# Patient Record
Sex: Female | Born: 1980 | Race: White | Hispanic: Yes | Marital: Single | Smoking: Current some day smoker
Health system: Southern US, Community
[De-identification: ages and names within clinical notes are randomized; demographics above are authoritative.]

## PROBLEM LIST (undated history)

## (undated) HISTORY — PX: THROAT SURGERY: SHX803

## (undated) MED ORDER — FLUOXETINE HCL 10 MG OR CAPS
10.00 mg | ORAL_CAPSULE | Freq: Every day | ORAL | 0 refills | Status: AC
Start: 2020-11-16 — End: ?

---

## 2004-03-19 ENCOUNTER — Emergency Department (HOSPITAL_COMMUNITY): Admission: EM | Admit: 2004-03-19 | Discharge: 2004-03-20 | Payer: Self-pay | Admitting: Emergency Medicine

## 2008-04-06 ENCOUNTER — Encounter: Payer: Self-pay | Admitting: Family Medicine

## 2008-04-06 ENCOUNTER — Ambulatory Visit: Payer: Self-pay | Admitting: Internal Medicine

## 2008-04-06 LAB — CONVERTED CEMR LAB: Chlamydia, DNA Probe: NEGATIVE

## 2008-04-07 ENCOUNTER — Ambulatory Visit: Payer: Self-pay | Admitting: *Deleted

## 2008-04-18 ENCOUNTER — Ambulatory Visit: Payer: Self-pay | Admitting: Internal Medicine

## 2008-05-13 ENCOUNTER — Ambulatory Visit: Payer: Self-pay | Admitting: Family Medicine

## 2008-05-13 ENCOUNTER — Encounter: Payer: Self-pay | Admitting: Family Medicine

## 2008-05-14 ENCOUNTER — Encounter: Payer: Self-pay | Admitting: Family Medicine

## 2008-05-14 LAB — CONVERTED CEMR LAB: Chlamydia, DNA Probe: NEGATIVE

## 2008-06-09 ENCOUNTER — Inpatient Hospital Stay (HOSPITAL_COMMUNITY): Admission: EM | Admit: 2008-06-09 | Discharge: 2008-06-24 | Payer: Self-pay | Admitting: Emergency Medicine

## 2008-06-09 ENCOUNTER — Ambulatory Visit: Payer: Self-pay | Admitting: Internal Medicine

## 2008-06-11 ENCOUNTER — Encounter (INDEPENDENT_AMBULATORY_CARE_PROVIDER_SITE_OTHER): Payer: Self-pay | Admitting: Internal Medicine

## 2008-07-06 ENCOUNTER — Ambulatory Visit: Payer: Self-pay | Admitting: Family Medicine

## 2008-07-08 ENCOUNTER — Ambulatory Visit: Payer: Self-pay | Admitting: Internal Medicine

## 2008-08-08 ENCOUNTER — Ambulatory Visit: Payer: Self-pay | Admitting: Family Medicine

## 2008-08-31 ENCOUNTER — Encounter: Payer: Self-pay | Admitting: Family Medicine

## 2008-08-31 ENCOUNTER — Ambulatory Visit: Payer: Self-pay | Admitting: Internal Medicine

## 2008-08-31 LAB — CONVERTED CEMR LAB
ALT: 21 units/L (ref 0–35)
CO2: 18 meq/L — ABNORMAL LOW (ref 19–32)
Creatinine, Ser: 0.6 mg/dL (ref 0.40–1.20)
Eosinophils Absolute: 0.1 10*3/uL (ref 0.0–0.7)
HCT: 42 % (ref 36.0–46.0)
HDL: 39 mg/dL — ABNORMAL LOW (ref >39)
Hemoglobin: 14 g/dL (ref 12.0–15.0)
LDL Cholesterol: 123 mg/dL — ABNORMAL HIGH (ref 0–99)
Lymphocytes Relative: 37 % (ref 12–46)
Lymphs Abs: 2.5 10*3/uL (ref 0.7–4.0)
MCV: 90.1 fL (ref 78.0–100.0)
Monocytes Absolute: 0.4 10*3/uL (ref 0.1–1.0)
Monocytes Relative: 6 % (ref 3–12)
Neutro Abs: 3.8 10*3/uL (ref 1.7–7.7)
Platelets: 241 10*3/uL (ref 150–400)
RDW: 13.6 % (ref 11.5–15.5)
Total CHOL/HDL Ratio: 5.2
Triglycerides: 207 mg/dL — ABNORMAL HIGH (ref <150)
VLDL: 41 mg/dL — ABNORMAL HIGH (ref 0–40)
WBC: 6.8 10*3/uL (ref 4.0–10.5)

## 2008-09-05 ENCOUNTER — Ambulatory Visit: Payer: Self-pay | Admitting: Internal Medicine

## 2008-09-09 ENCOUNTER — Ambulatory Visit (HOSPITAL_COMMUNITY): Admission: RE | Admit: 2008-09-09 | Discharge: 2008-09-09 | Payer: Self-pay | Admitting: Family Medicine

## 2008-10-03 ENCOUNTER — Ambulatory Visit: Payer: Self-pay | Admitting: Internal Medicine

## 2008-10-19 ENCOUNTER — Ambulatory Visit: Payer: Self-pay | Admitting: Internal Medicine

## 2008-10-19 DIAGNOSIS — R0989 Other specified symptoms and signs involving the circulatory and respiratory systems: Secondary | ICD-10-CM

## 2008-10-19 DIAGNOSIS — R0609 Other forms of dyspnea: Secondary | ICD-10-CM

## 2008-11-02 ENCOUNTER — Ambulatory Visit: Payer: Self-pay | Admitting: Internal Medicine

## 2008-11-28 ENCOUNTER — Encounter: Payer: Self-pay | Admitting: Internal Medicine

## 2008-11-28 ENCOUNTER — Ambulatory Visit: Payer: Self-pay | Admitting: Internal Medicine

## 2008-11-28 DIAGNOSIS — K219 Gastro-esophageal reflux disease without esophagitis: Secondary | ICD-10-CM

## 2008-11-28 DIAGNOSIS — J309 Allergic rhinitis, unspecified: Secondary | ICD-10-CM | POA: Insufficient documentation

## 2008-11-28 DIAGNOSIS — J209 Acute bronchitis, unspecified: Secondary | ICD-10-CM | POA: Insufficient documentation

## 2008-12-19 ENCOUNTER — Ambulatory Visit: Payer: Self-pay | Admitting: Internal Medicine

## 2009-01-20 ENCOUNTER — Ambulatory Visit (HOSPITAL_COMMUNITY): Admission: RE | Admit: 2009-01-20 | Discharge: 2009-01-20 | Payer: Self-pay | Admitting: Otolaryngology

## 2009-02-13 ENCOUNTER — Ambulatory Visit (HOSPITAL_COMMUNITY): Admission: RE | Admit: 2009-02-13 | Discharge: 2009-02-13 | Payer: Self-pay | Admitting: Otolaryngology

## 2009-03-20 ENCOUNTER — Ambulatory Visit: Payer: Self-pay | Admitting: Internal Medicine

## 2009-12-26 ENCOUNTER — Ambulatory Visit: Payer: Self-pay | Admitting: Internal Medicine

## 2010-03-26 IMAGING — CR DG CHEST 2V
2 series · 2 of 2 positions shown · non-contrast
Comparison: None available.

CLINICAL DATA: Fever.

CHEST - 2 VIEW

[w chest pa]
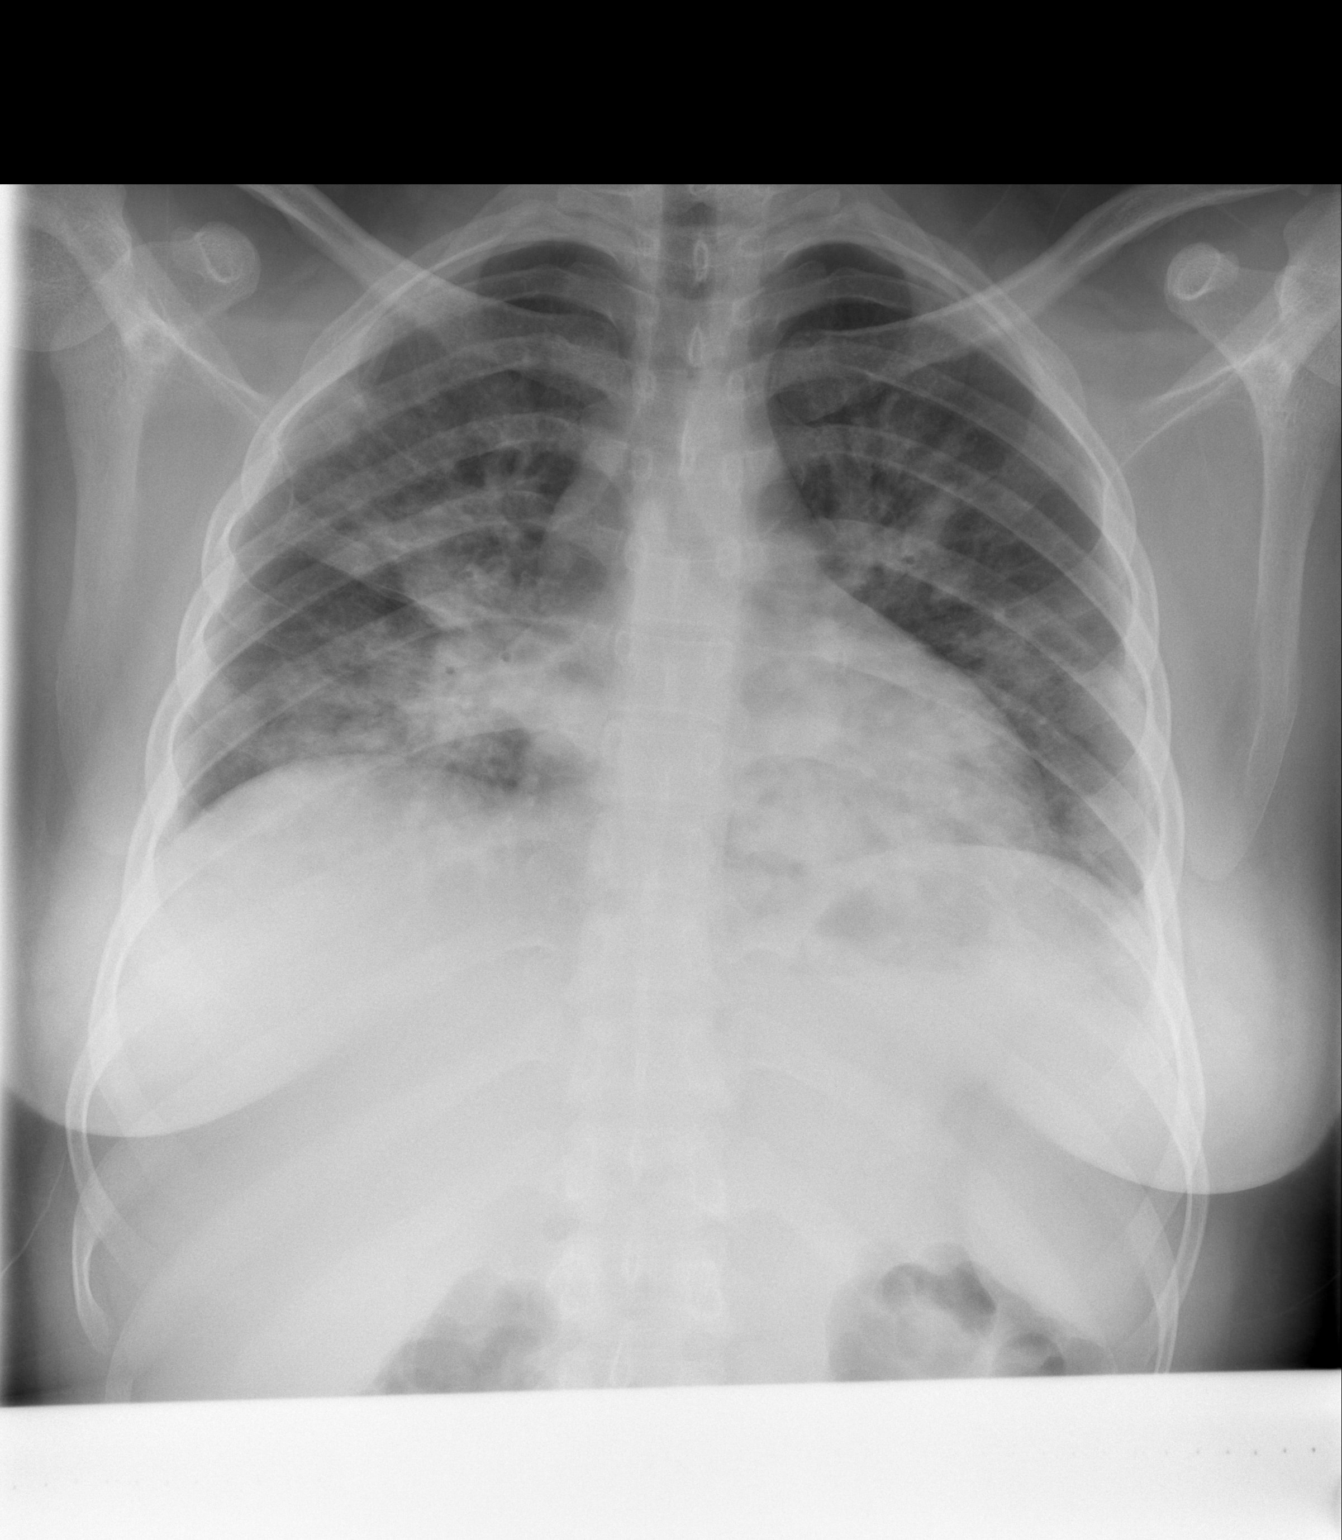

[w chest lat]
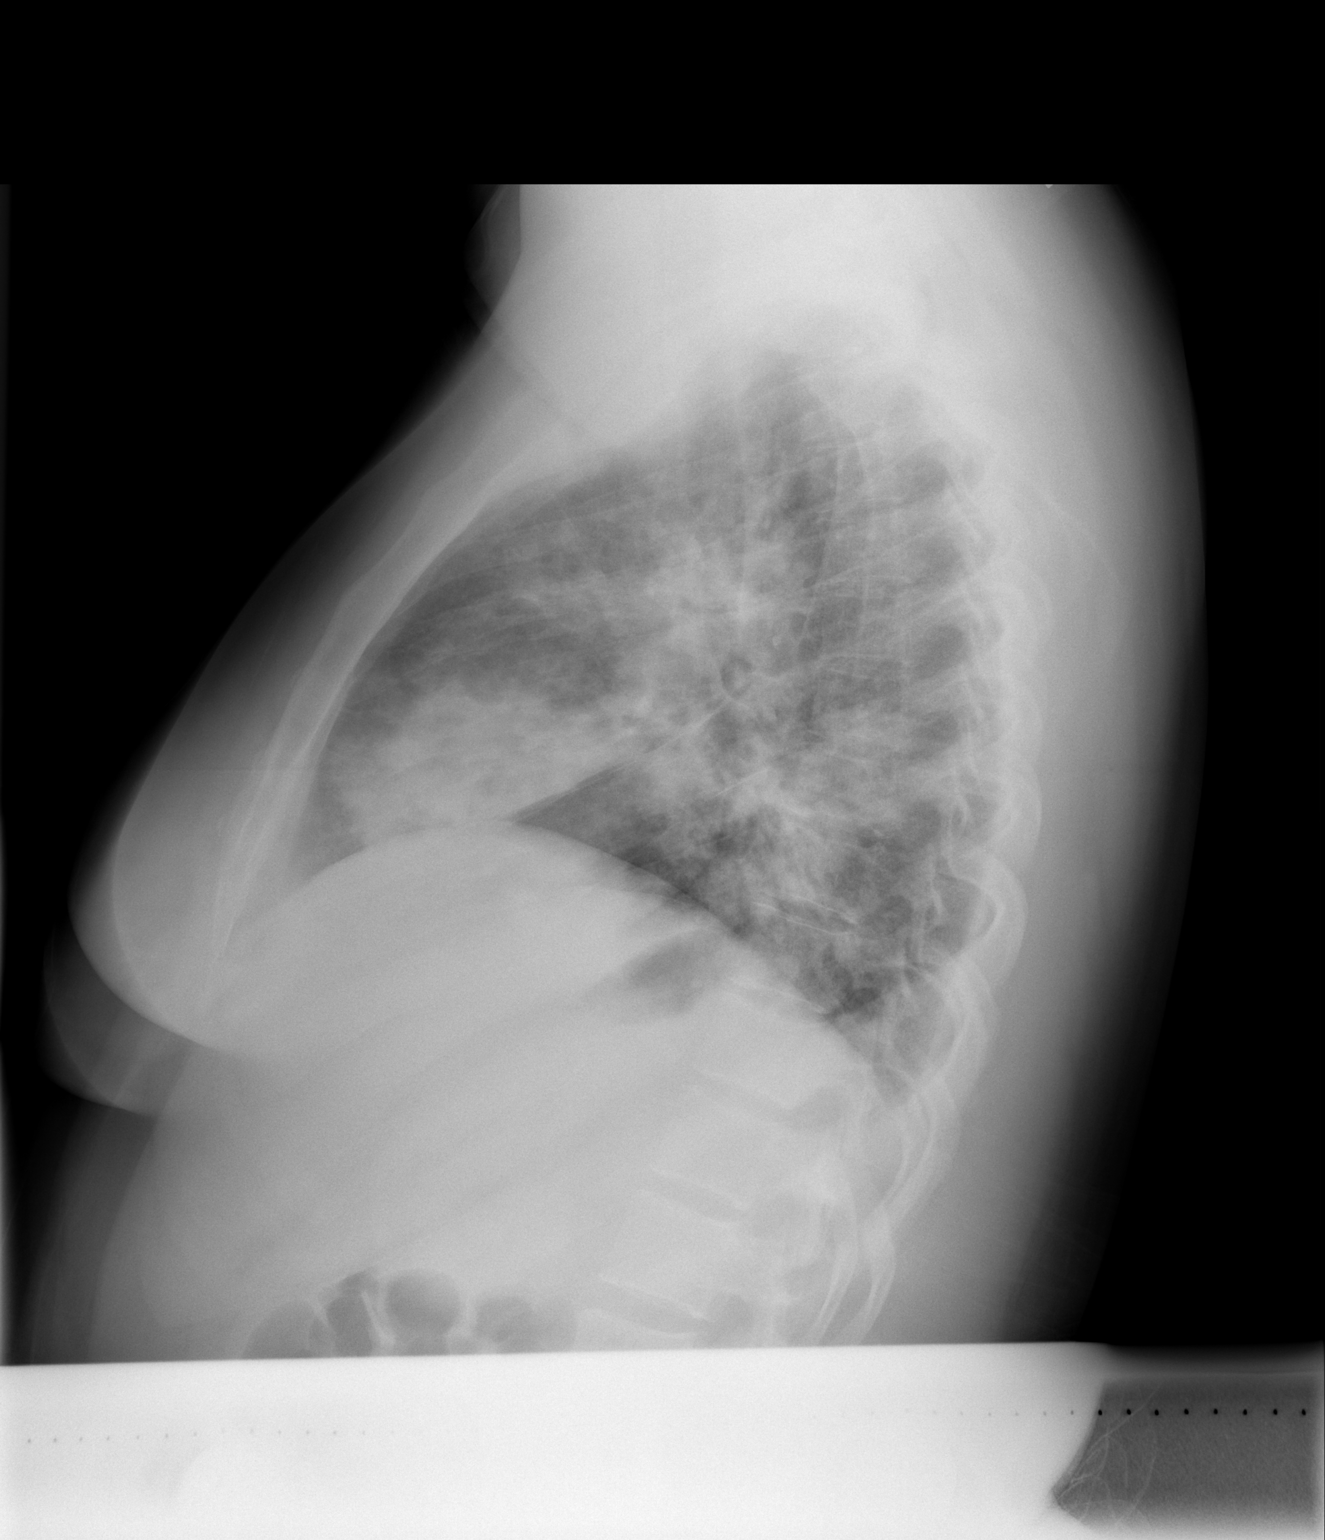

[2 of 2 positions shown; findings below may reference images not displayed]

FINDINGS: Patchy bilateral airspace disease is present.  There is
disease involving the right upper lobe and right middle lobe.  The
left lower lobe airspace disease is also seen.  Mild edema is
present.  Heart size is normal.  Lung volumes are low.
IMPRESSION: 1.  Multilobar pneumonia involving at least the right upper, right
middle, and left lower lobe.
2.  Mild edema.

## 2010-11-06 IMAGING — CT CT NECK W/ CM
3 of 4 series · 16 of 33 positions shown, 19 images · IV contrast (agent unspecified)
Comparison: None

CLINICAL DATA: Recent pneumonia.  Long-term intubation.  Assess for
tracheal or subglottic stenosis.

CT NECK WITH CONTRAST
TECHNIQUE: Multidetector CT imaging of the neck was performed with
intravenous contrast.
Contrast: 100 ml 5mnipaque-TPP

[Series 2: 2cc/(id)/1cc/(id) · axial · 0.43mm/px · z∈[-222,-46]mm · 8 of 59 slices shown, 10 images]
[im 6/59  soft-tissue]
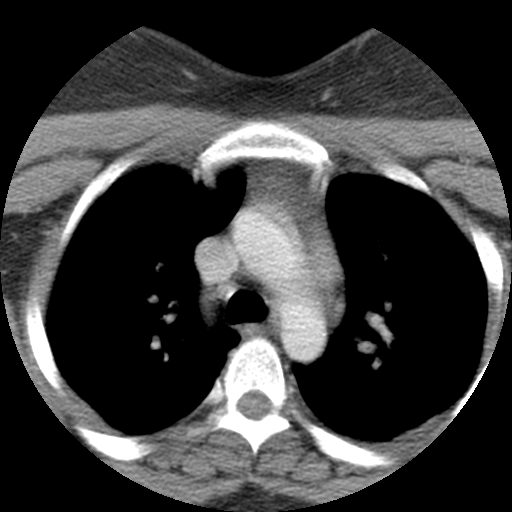
[im 6/59  bone]
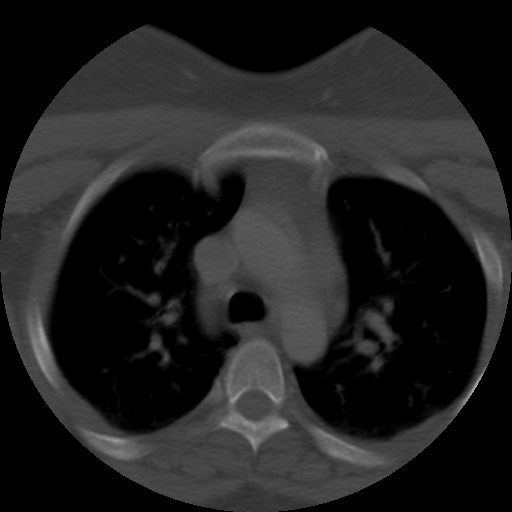
[im 12/59  bone]
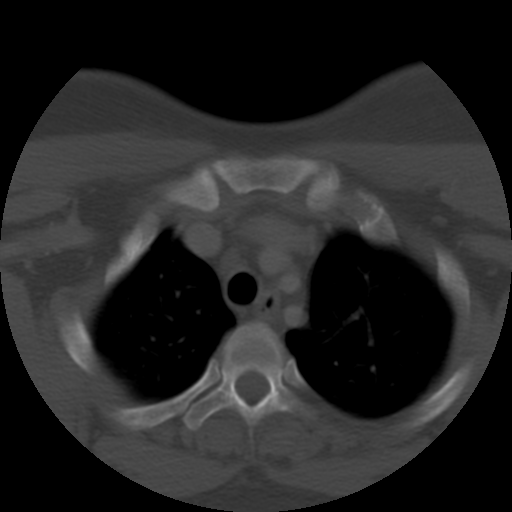
[im 18/59  bone]
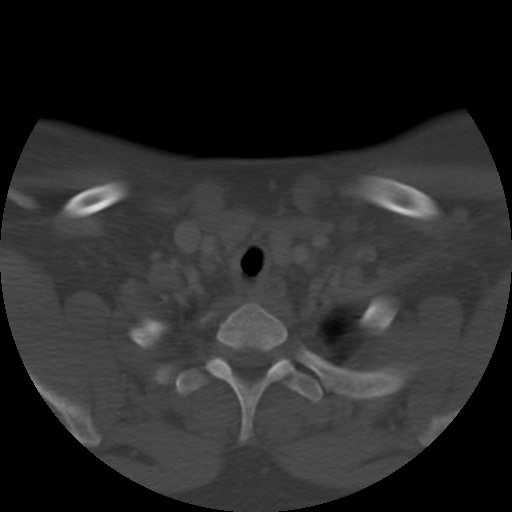
[im 24/59  bone]
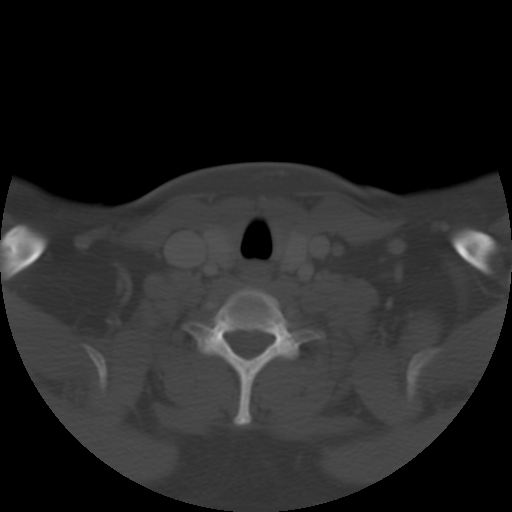
[im 35/59  soft-tissue]
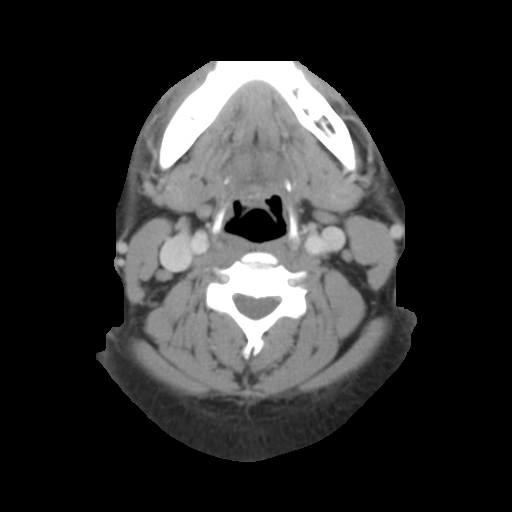
[im 35/59  bone]
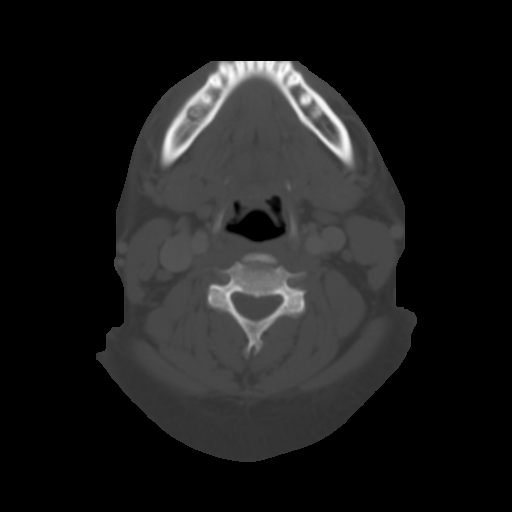
[im 41/59  bone]
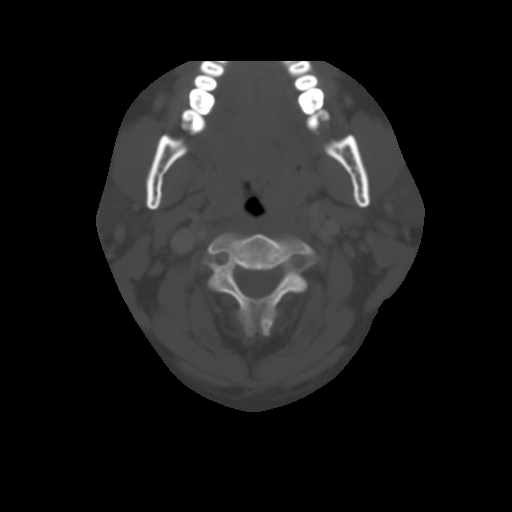
[im 47/59  bone]
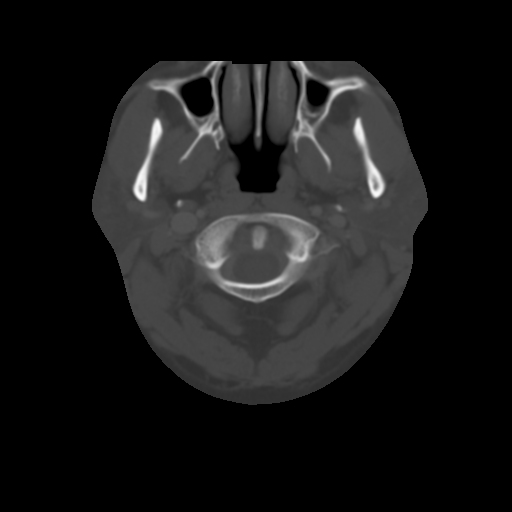
[im 53/59  bone]
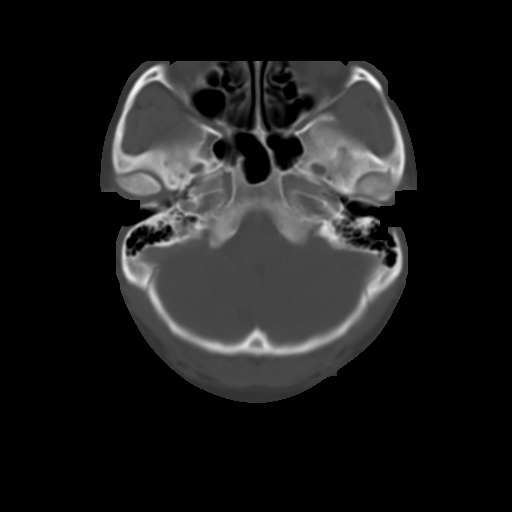

[Series 300: sag neck · sagittal · 0.43mm/px · 5 of 83 slices shown, 6 images]
[im 28/83  bone]
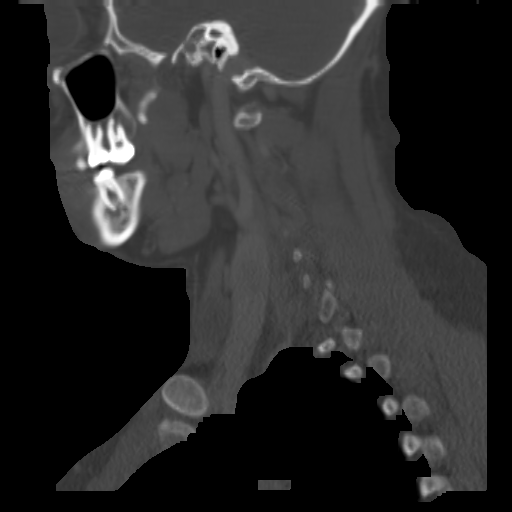
[im 35/83  bone]
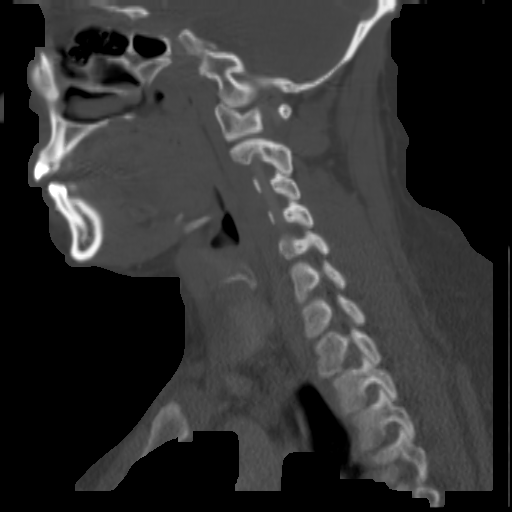
[im 42/83  soft-tissue]
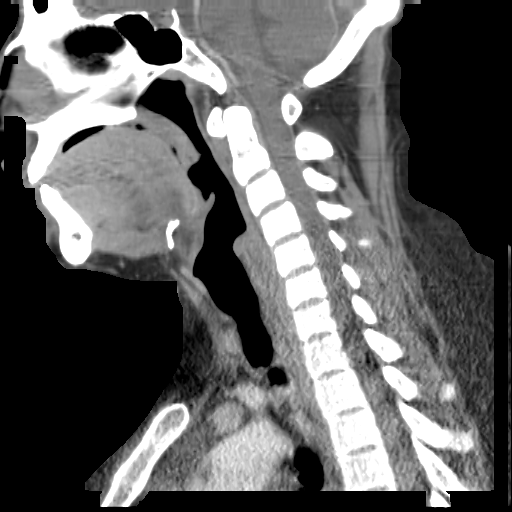
[im 42/83  bone]
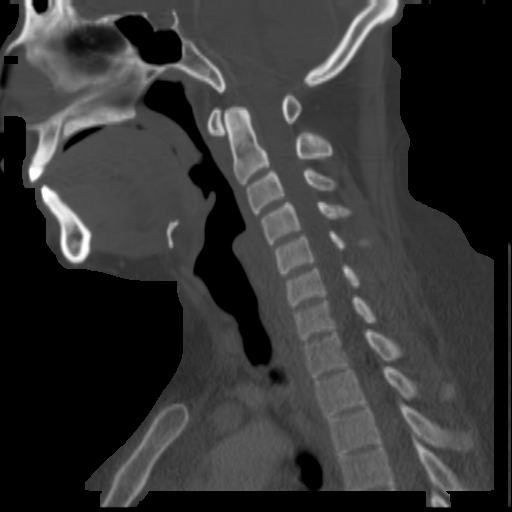
[im 48/83  bone]
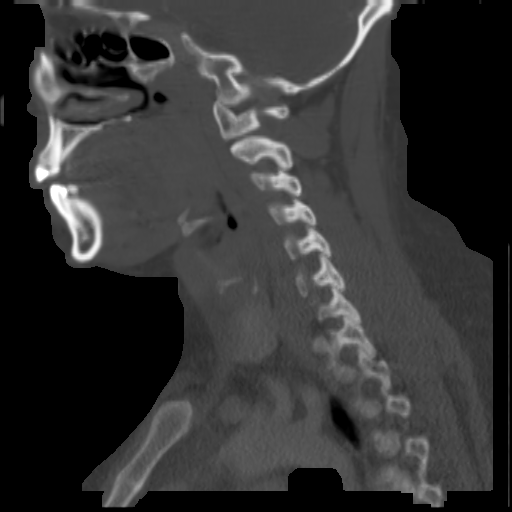
[im 55/83  bone]
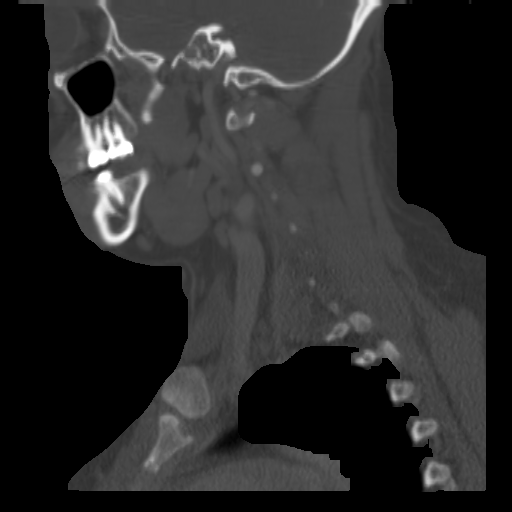

[Series 301: cor neck · coronal · 0.43mm/px · 3 of 84 slices shown]
[im 22/84  bone]
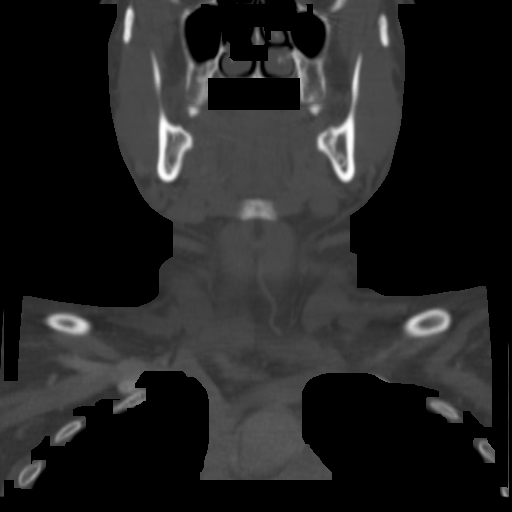
[im 35/84  bone]
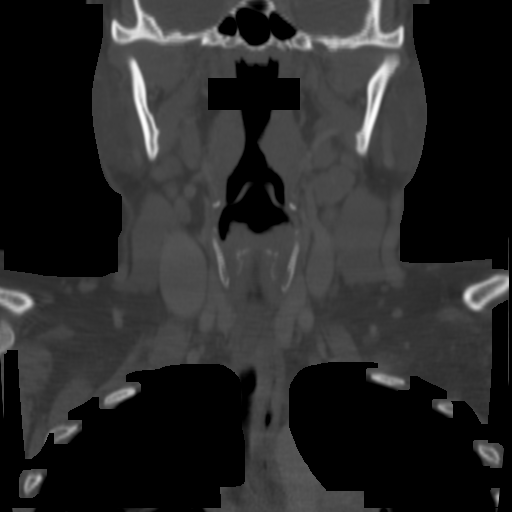
[im 49/84  bone]
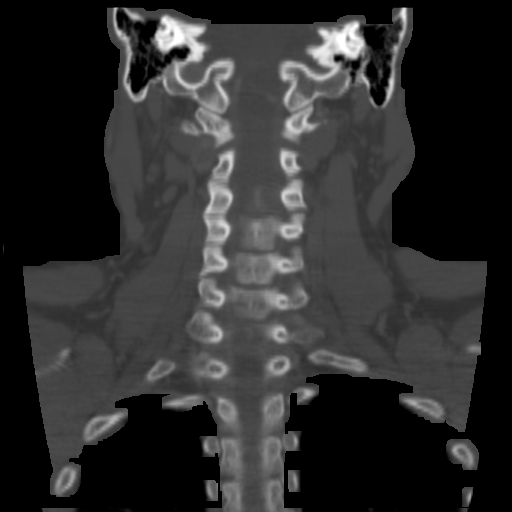

[16 of 33 positions shown; findings below may reference images not displayed]

FINDINGS: Visualized intracranial contents are normal.  Lung apices
are clear.  No superior mediastinal pathology is seen.  Parotid,
submandibular and thyroid glands appear normal.  Arterial and
venous structures appear normal.  No evidence of mass, adenopathy
or fluid collection.

The patient does have tracheal stenosis at the thoracic inlet
region.  There are serial stenoses where the lumen is considerably
narrowed.  Normal diameter in that region is approximately 11-12
mm.  At those levels, the lumen is narrowed to as little as 5 mm.
The upper stenosis may even have a weblike component that could be
more severe.  The overall segment of the trachea that is affected
measures about 2 cm in length.
IMPRESSION: 2 cm segment of abnormality affecting the trachea at the thoracic
inlet region.  There are serial stenoses in that region that narrow
the lumen to approximately 5 mm.  The upper area of narrowing may
even have a weblike stenosis that could be more severe.

## 2010-11-26 LAB — CBC
MCHC: 34.7 g/dL (ref 30.0–36.0)
RBC: 4.55 MIL/uL (ref 3.87–5.11)

## 2011-01-01 NOTE — Group Therapy Note (Signed)
NAME:  Laura Nash, Laura Nash NO.:  [id number]   MEDICAL RECORD NO.:  [id number]          PATIENT TYPE:  INP   LOCATION:  5511                         FACILITY:  [facility]   PHYSICIAN:  Monte Fantasia, MD  DATE OF BIRTH:  03-Dec-1980                                 PROGRESS NOTE   A 30 year old Hispanic female.  The patient was admitted on June 08, 2008 with fever, cough for possible community acquired pneumonia.  The  patient was initially treated for H1 N1, had respiratory failure and was  intubated on June 18, 2008.  The patient was successfully extubated  in the ICU.  She was treated for the community acquired pneumonia along  with H1 N1 and was successfully later extubated on June 20, 2008.  The patient at present is doing symptomatically much better,  comfortable.  Denies any shortness of breath, chest pains or fever.  Has  improved since her admission.  Denies any complaints at present.   PHYSICAL EXAMINATION:  VITAL SIGNS:  From the morning revealed temperature of 98.8, pulse of  88, respirations 20, blood pressure 94/63, oxygen saturation 97% on 2  liters.  HEENT:  Neck is supple.  Pupils equally reacting to light.  No pallor.  No lymphadenopathy.  RESPIRATORY:  Air entry is bilaterally equal.  No rales, no rhonchi.  CARDIOVASCULAR:  S1 and S2 normal.  Regular rate and rhythm.  No  murmurs.  ABDOMEN:  Soft.  No organomegaly, no masses felt.  No distention, no  guarding, no rigidity.  EXTREMITIES:  No edema of feet.   LABORATORY DATA:  Labs done today - total WBC of 9.4, hemoglobin and hematocrit of 11.6  and 32.8, platelets 637.  Sodium 138, potassium 4.1, chloride 104,  bicarbonate 25, glucose 109, BUN 15, creatinine 0.7, total bilirubin  0.8, alkaline phosphatase 57, AST 24, ALT 39, total protein 8.1, albumin  3.3, calcium 9.27, phosphorus 5.7, magnesium 2.4.   ASSESSMENT AND PLAN:  1. Community acquired pneumonia, resolved.  2. Respiratory  failure, resolved, status post extubation.  3. The patient being treated for H1 N1 with Tamiflu.  Cultures to date      have been negative.   The patient has received 10 days of Rocephin and Zithromax.  At present,  has been discontinued off all antibiotics.  Also, Tamiflu has been  discontinued.  At present, the patient is not on any antibiotics.  Has  had no temperature spikes, has a normal WBC count, and has no  complaints.  We will continue albuterol nebulizations for p.r.n.  shortness of breath.  The patient also maintaining good saturation of  97% with room air.  Physical therapy evaluation has been appreciated,  and the patient is doing well with the physical therapy.  The patient  has received an influenza vaccine.   The patient is on DVT and GI prophylaxis.      Monte Fantasia, MD  Electronically Signed     MP/MEDQ  D:  06/22/2008  T:  06/22/2008  Job:  [id number]

## 2011-01-01 NOTE — H&P (Signed)
NAME:  Laura Nash, Laura Nash NO.:  [id number]   MEDICAL RECORD NO.:  [id number]          PATIENT TYPE:  INP   LOCATION:  5509                         FACILITY:  [facility]   PHYSICIAN:  Renee Ramus, MD       DATE OF BIRTH:  05-25-1981   DATE OF ADMISSION:  06/08/2008  DATE OF DISCHARGE:                              HISTORY & PHYSICAL   PRIMARY CARE PHYSICIAN:  None.   HISTORY OF PRESENT ILLNESS:  The patient is a 30 year old Hispanic  female who comes to the emergency department with cough, myalgias, sore  throat, congestion, and fevers.  The patient denies nausea or vomiting.  The patient denies chest pain.  The patient has had recent travel to  [place].  Denies previous history of pneumonia.  The patient's chief  complaint is sore throat.  She has taken some [place] medicines, but  we are unsure what they were.  The patient was seen in the emergency  department and diagnosed with multilobar pneumonia.   PAST MEDICAL HISTORY:  None.   SOCIAL HISTORY:  No tobacco or alcohol use.  The patient lives at home.   FAMILY HISTORY:  Not available.   REVIEW OF SYSTEMS:  All other comprehensive review systems are negative.   MEDICATIONS:  None.   ALLERGIES:  NKDA.   PHYSICAL EXAMINATION:  GENERAL:  She is a well-developed, well-nourished  Hispanic female, currently in no apparent distress.  VITAL SIGNS:  Blood pressure 108/72, pulse 116, respiratory 22, and  temperature 102.9.  She is 87% on room air, correcting to 94% on 4  liters per minute.  HEENT:  No jugular venous distention or lymphadenopathy.  Oropharynx is  clear.  Mucous membranes are pink and moist.  TMs are clear bilaterally.  Pupils are equal and reactive to light and accommodation.  Extraocular  muscles are intact.  CARDIOVASCULAR:  Regular rate and rhythm without murmurs, rubs, or  gallops, but tachy.  PULMONARY:  The patient has rhonchi predominantly in the right middle  lobe and right lower lobe with  consolidation noted in the right lower  lobe and left lower lobe.  ABDOMEN:  Soft, nontender, and nondistended with hepatosplenomegaly.  Bowel sounds are present.  She has no rebound or guarding.  EXTREMITIES:  She has no clubbing, cyanosis, or edema.  She has good  peripheral pulses, dorsalis pedis, and radial artery.  She is able to  move all extremity.  NEURO:  Cranial nerves II-XII are grossly intact.  She has no focal  neurological deficits.   STUDIES:  White count 4.5, H&H 14 and 42, MCV 91, and platelets 129.  Sodium 137, potassium 3.7, chloride 101, bicarb 25, BUN 9, creatinine  1.0, and glucose 171.  Chest x-ray shows multilobar pneumonia in the  right upper lobe, right middle lobe, and left lower lobe.   ASSESSMENT/PLAN:  1. Community-acquired pneumonia, treated with Tylenol, Rocephin,      erythromycin, and IV fluids.  Recheck CRP, ESR, and white count in      a.m.  2. Question of pharyngitis.  We will treat with Phenergan With Codeine  p.r.n.   DISPOSITION:  Most likely discharge in 1-2 days when the patient is  medically stable.  H&P was constructed by reviewing past medical  history, conferring with emergency medical room physician, reviewing the  emergency medical record.   TIME SPENT:  1 hour.      Renee Ramus, MD  Electronically Signed     JF/MEDQ  D:  06/09/2008  T:  06/10/2008  Job:  [id number]

## 2011-01-01 NOTE — Op Note (Signed)
NAME:  Laura Nash, Laura Nash           ACCOUNT NO.:  [id number]   MEDICAL RECORD NO.:  [id number]          PATIENT TYPE:  AMB   LOCATION:  SDS                          FACILITY:  [facility]   PHYSICIAN:  Jefry H. Pollyann Kennedy, MD     DATE OF BIRTH:  10-Jun-1981   DATE OF PROCEDURE:  02/13/2009  DATE OF DISCHARGE:  02/13/2009                               OPERATIVE REPORT   PREOPERATIVE DIAGNOSIS:  Tracheal stenosis.   POSTOPERATIVE DIAGNOSIS:  Tracheal stenosis.   PROCEDURE:  Microlaryngoscopy with bronchoscopy, laser treatment of  tracheal stenosis, and dilatation with an inflatable tracheal dilator up  to 16.5 mm.   HISTORY:  This is a 30 year old who had a severe case of H1N1 flu last  fall, was intubated for about 2-1/2 weeks, on ventilator, and  subsequently developed moderate stridor with exercise intolerance.  CT  evaluation revealed a stenotic web-like segment in the upper trachea.  Risks, benefits, alternatives, and complications of the procedure were  explained to the patient, seemed to understand and agreed to surgery.   PROCEDURE IN DETAIL:  The patient was taken to the operating room and  placed on the operating table in supine position.  Following induction  of general anesthesia, the table was turned 90 degrees and the laser  laryngoscope was entered into the oral cavity, used to view the larynx.  The gentle ventilation system was used to ventilate during this  procedure.  A maxillary and mandibular tooth protector was used prior to  the insertion of the laryngoscope.  The laryngoscope was attached to  Mayo stand with the suspension apparatus.  The microscope was brought  into view.  A telescopic rod was used to closely inspect the stenotic  segment and beyond that to make sure there were no further stenosis and  they were knocked down to the carina.  The remainder of the trachea was  healthy in appearance.  The stenosis corresponds proximally where the  edge of the endotracheal  tube was likely sitting over the lower aspect  of the cuff.  Radial cuts were made using the CO2 laser at 6 o'clock, 8  o'clock, and 10 o'clock.  The stenosis was somewhat asymmetric and  concentrated around the left anterior aspect of the trachea.  After the  laser cuts were made, the flexible balloon dilator was used and 2  treatments were performed with each 45 seconds in length dilating up to  16.5 mm.  Following that, small amount of blood and secretions were  suctioned out of the airway.  The stenotic area looked dramatically  improved.  The patient was starting to awaken from anesthesia, so she  was not intubated at this point, just awakened from anesthesia and then  transferred to recovery in stable condition.      Jefry H. Pollyann Kennedy, MD  Electronically Signed     JHR/MEDQ  D:  02/13/2009  T:  02/14/2009  Job:  [id number]

## 2011-01-01 NOTE — Discharge Summary (Signed)
NAME:  Laura Nash, Laura Nash           ACCOUNT NO.:  [id number]   MEDICAL RECORD NO.:  [id number]          PATIENT TYPE:  INP   LOCATION:  5523                         FACILITY:  [facility]   PHYSICIAN:  Monte Fantasia, MD  DATE OF BIRTH:  01-Apr-1981   DATE OF ADMISSION:  06/08/2008  DATE OF DISCHARGE:  06/24/2008                               DISCHARGE SUMMARY   DISCHARGING DIAGNOSES:  1. Community-acquired pneumonia.  2. Status post respiratory failure, status post extubation.  3. The patient treated for H1N1 with Tamiflu.   MEDICATIONS AT DISCHARGE:  1. Albuterol 2 puffs inhalation q.6 h.  2. Nutritional supplementation 240 mL p.o. daily.  3. Protonix 40 mg p.o. q.12 h.  4. Atrovent inhalations q.6 h. p.r.n. breathlessness.  5. Multivitamins 1 tablet p.o. daily.   COURSE DURING THE HOSPITAL STAY:  The patient is a 30 year old Hispanic  lady patient who was admitted on June 08, 2008 with cough, myalgias,  and productive phlegm, was admitted for possible community-acquired  pneumonia.  Initially, was treated for H1N1.  The patient went into  respiratory failure and was intubated on June 18, 2008.  The patient  recovered well and was successfully extubated in the ICU.  The patient  also was treated for the community-acquired pneumonia with Rocephin,  Zithromax, and vancomycin which was later discontinued.  The patient  improved symptomatically very well.  Urine cultures done on June 20, 2008 through [facility] which is sensitive to linezolid; however, the patient  has been completely asymptomatic.  Denies any pain on passing urine or  fevers.  She has had no leukocytosis.  As per discussion with the ID,  would defer treating at this time; however, if any time the patient has  any complaints of fever or rise in WBC count, to consider treatment with  VRE.   VITAL SIGNS:  Temperature 98.7, heart rate 101, respirations 20, blood  pressure 97/65, and oxygen saturation is 95% on room  air.  HEENT:  Pupils equal and reacting to light.  No pallor.  No  lymphadenopathy.  Mucous are moist.  NECK:  Supple.  RESPIRATORY:  Air entry is bilaterally equal.  No rales.  No rhonchi.  Bilateral vesicular breath sounds.  CARDIOVASCULAR:  S1 and S2 normal.  Regular rate and rhythm.  UPPER ABDOMEN:  Soft.  No organomegaly.  No distension.  No tenderness.  EXTREMITIES:  No edema feet.   LABORATORY DATA:  Total WBC at 7.3, hemoglobin 11.8, hematocrit 34.1,  and platelets is 574.  Sodium 138, potassium 3.9, chloride 101, bicarb  is 27, glucose 104, BUN 15, creatinine 0.75, total bilirubin 0.7,  alkaline phosphatase 57, AST is 22, ALT is 37, total protein 7.9,  albumin 3.5, and calcium 9.5.  Chest x-ray done on June 24, 2008,  shows patchy bilateral airspace disease persistent with low lung  volumes.  No large effusion on pneumothorax, normal size vascularity.   IMPRESSION:  Improvement in bilateral pneumonia.   ASSESSMENT AND PLAN:  Plan to discharge the patient.  She has completed  the course for antibiotics, has symptomatically improved.  We will  continue albuterol and  Atrovent for p.r.n. breathlessness.  The patient  has received influenza vaccine on June 12, 2008.   The patient is on DVT and GI prophylaxis.      Monte Fantasia, MD  Electronically Signed     MP/MEDQ  D:  06/24/2008  T:  06/25/2008  Job:  [id number]

## 2011-05-20 LAB — POCT I-STAT 3, ART BLOOD GAS (G3+)
Acid-Base Excess: 3 — ABNORMAL HIGH
Acid-Base Excess: 5 — ABNORMAL HIGH
Acid-Base Excess: 8 — ABNORMAL HIGH
Acid-base deficit: 1
Acid-base deficit: 2
Bicarbonate: 25.3 — ABNORMAL HIGH
Bicarbonate: 26.8 — ABNORMAL HIGH
Bicarbonate: 28.9 — ABNORMAL HIGH
Bicarbonate: 30.7 — ABNORMAL HIGH
Bicarbonate: 31.5 — ABNORMAL HIGH
Bicarbonate: 31.5 — ABNORMAL HIGH
O2 Saturation: 90
O2 Saturation: 90
O2 Saturation: 93
O2 Saturation: 94
O2 Saturation: 95
O2 Saturation: 97
Patient temperature: 100.6
Patient temperature: 100.7
Patient temperature: 102.1
Patient temperature: 37
Patient temperature: 37
Patient temperature: 98
Patient temperature: 98.3
Patient temperature: 98.5
Patient temperature: 98.6
TCO2: 27
TCO2: 32
TCO2: 33
pCO2 arterial: 40.1
pCO2 arterial: 45.4 — ABNORMAL HIGH
pCO2 arterial: 47.9 — ABNORMAL HIGH
pH, Arterial: 7.332 — ABNORMAL LOW
pH, Arterial: 7.338 — ABNORMAL LOW
pH, Arterial: 7.401 — ABNORMAL HIGH
pH, Arterial: 7.466 — ABNORMAL HIGH
pH, Arterial: 7.479 — ABNORMAL HIGH
pH, Arterial: 7.489 — ABNORMAL HIGH
pO2, Arterial: 100
pO2, Arterial: 115 — ABNORMAL HIGH
pO2, Arterial: 68 — ABNORMAL LOW
pO2, Arterial: 71 — ABNORMAL LOW
pO2, Arterial: 73 — ABNORMAL LOW
pO2, Arterial: 83

## 2011-05-20 LAB — CULTURE, RESPIRATORY W GRAM STAIN: Culture: NO GROWTH

## 2011-05-20 LAB — POCT I-STAT, CHEM 8
BUN: 9
HCT: 42
Hemoglobin: 14.3
Sodium: 137
TCO2: 25

## 2011-05-20 LAB — DIFFERENTIAL
Eosinophils Relative: 0
Eosinophils Relative: 0
Lymphocytes Relative: 19
Lymphocytes Relative: 23
Lymphs Abs: 0.8
Monocytes Absolute: 0.1
Monocytes Relative: 3
Neutro Abs: 3.3

## 2011-05-20 LAB — URINALYSIS, ROUTINE W REFLEX MICROSCOPIC
Glucose, UA: NEGATIVE
Ketones, ur: NEGATIVE
Protein, ur: 100 — AB
Urobilinogen, UA: 2 — ABNORMAL HIGH

## 2011-05-20 LAB — GLUCOSE, CAPILLARY
Glucose-Capillary: 100 — ABNORMAL HIGH
Glucose-Capillary: 103 — ABNORMAL HIGH
Glucose-Capillary: 103 — ABNORMAL HIGH
Glucose-Capillary: 110 — ABNORMAL HIGH
Glucose-Capillary: 112 — ABNORMAL HIGH
Glucose-Capillary: 112 — ABNORMAL HIGH
Glucose-Capillary: 114 — ABNORMAL HIGH
Glucose-Capillary: 115 — ABNORMAL HIGH
Glucose-Capillary: 118 — ABNORMAL HIGH
Glucose-Capillary: 118 — ABNORMAL HIGH
Glucose-Capillary: 123 — ABNORMAL HIGH
Glucose-Capillary: 124 — ABNORMAL HIGH
Glucose-Capillary: 127 — ABNORMAL HIGH
Glucose-Capillary: 127 — ABNORMAL HIGH
Glucose-Capillary: 129 — ABNORMAL HIGH
Glucose-Capillary: 132 — ABNORMAL HIGH
Glucose-Capillary: 132 — ABNORMAL HIGH
Glucose-Capillary: 133 — ABNORMAL HIGH
Glucose-Capillary: 140 — ABNORMAL HIGH
Glucose-Capillary: 141 — ABNORMAL HIGH
Glucose-Capillary: 142 — ABNORMAL HIGH
Glucose-Capillary: 143 — ABNORMAL HIGH
Glucose-Capillary: 143 — ABNORMAL HIGH
Glucose-Capillary: 147 — ABNORMAL HIGH
Glucose-Capillary: 98

## 2011-05-20 LAB — CBC
HCT: 29.8 — ABNORMAL LOW
HCT: 30.4 — ABNORMAL LOW
HCT: 30.7 — ABNORMAL LOW
HCT: 41.5
Hemoglobin: 10.2 — ABNORMAL LOW
Hemoglobin: 10.3 — ABNORMAL LOW
Hemoglobin: 10.4 — ABNORMAL LOW
Hemoglobin: 10.6 — ABNORMAL LOW
Hemoglobin: 14.3
MCHC: 34.4
MCHC: 34.4
MCHC: 34.7
MCHC: 34.7
MCHC: 35.3
MCHC: 35.5
MCV: 90.6
MCV: 90.7
MCV: 91.1
MCV: 91.1
MCV: 91.2
MCV: 91.2
MCV: 91.5
MCV: 91.7
MCV: 91.9
MCV: 92.3
Platelets: 109 — ABNORMAL LOW
Platelets: 116 — ABNORMAL LOW
Platelets: 118 — ABNORMAL LOW
Platelets: 176
Platelets: 233
Platelets: 263
Platelets: 422 — ABNORMAL HIGH
Platelets: 467 — ABNORMAL HIGH
RBC: 3.24 — ABNORMAL LOW
RBC: 3.24 — ABNORMAL LOW
RBC: 3.7 — ABNORMAL LOW
RBC: 4.53
RDW: 13.4
RDW: 13.4
RDW: 13.4
RDW: 13.5
RDW: 13.7
WBC: 12 — ABNORMAL HIGH
WBC: 12.5 — ABNORMAL HIGH
WBC: 15.4 — ABNORMAL HIGH
WBC: 3.9 — ABNORMAL LOW
WBC: 4.5

## 2011-05-20 LAB — COMPREHENSIVE METABOLIC PANEL
ALT: 68 — ABNORMAL HIGH
AST: 81 — ABNORMAL HIGH
AST: 92 — ABNORMAL HIGH
Albumin: 2.3 — ABNORMAL LOW
Albumin: 2.4 — ABNORMAL LOW
Albumin: 2.7 — ABNORMAL LOW
Alkaline Phosphatase: 78
BUN: 9
BUN: 9
CO2: 24
CO2: 30
Calcium: 6.9 — ABNORMAL LOW
Calcium: 7.3 — ABNORMAL LOW
Calcium: 7.4 — ABNORMAL LOW
Chloride: 107
Chloride: 109
Chloride: 112
Creatinine, Ser: 0.5
Creatinine, Ser: 0.56
Creatinine, Ser: 0.63
Creatinine, Ser: 0.8
Creatinine, Ser: 0.83
GFR calc Af Amer: >60
GFR calc Af Amer: >60
GFR calc Af Amer: >60
GFR calc non Af Amer: >60
GFR calc non Af Amer: >60
GFR calc non Af Amer: >60
Glucose, Bld: 120 — ABNORMAL HIGH
Glucose, Bld: 139 — ABNORMAL HIGH
Potassium: 4.4
Sodium: 138
Sodium: 142
Total Bilirubin: 0.8
Total Bilirubin: 0.9
Total Protein: 5.3 — ABNORMAL LOW
Total Protein: 5.5 — ABNORMAL LOW

## 2011-05-20 LAB — BLOOD GAS, ARTERIAL
Bicarbonate: 24.6 — ABNORMAL HIGH
Drawn by: 280971
MECHVT: 300
O2 Content: 5
PEEP: 5
Patient temperature: 100.5
Patient temperature: 98
pH, Arterial: 7.386
pH, Arterial: 7.474 — ABNORMAL HIGH
pO2, Arterial: 54.9 — ABNORMAL LOW

## 2011-05-20 LAB — LACTIC ACID, PLASMA: Lactic Acid, Venous: 0.5

## 2011-05-20 LAB — BASIC METABOLIC PANEL
BUN: 10
BUN: 11
BUN: 15
BUN: 16
CO2: 31
CO2: 32
Calcium: 7.8 — ABNORMAL LOW
Calcium: 8.7
Chloride: 105
Chloride: 113 — ABNORMAL HIGH
Creatinine, Ser: 0.41
Creatinine, Ser: 0.52
Creatinine, Ser: 0.52
Creatinine, Ser: 0.59
GFR calc Af Amer: >60
GFR calc Af Amer: >60
GFR calc non Af Amer: >60
GFR calc non Af Amer: >60
GFR calc non Af Amer: >60
Glucose, Bld: 114 — ABNORMAL HIGH
Glucose, Bld: 123 — ABNORMAL HIGH
Glucose, Bld: 146 — ABNORMAL HIGH
Potassium: 3.1 — ABNORMAL LOW
Potassium: 3.4 — ABNORMAL LOW
Potassium: 4.2
Potassium: 4.2
Sodium: 140
Sodium: 145

## 2011-05-20 LAB — CULTURE, BLOOD (ROUTINE X 2): Culture: NO GROWTH

## 2011-05-20 LAB — CULTURE, BAL-QUANTITATIVE W GRAM STAIN
Colony Count: NO GROWTH
Culture: NO GROWTH

## 2011-05-20 LAB — VANCOMYCIN, TROUGH
Vancomycin Tr: 11.3
Vancomycin Tr: 8.5 — ABNORMAL LOW

## 2011-05-20 LAB — CARBOXYHEMOGLOBIN
Carboxyhemoglobin: 0.5
Methemoglobin: 1
O2 Saturation: 69.5
Total hemoglobin: 11.6 — ABNORMAL LOW

## 2011-05-20 LAB — URINE CULTURE
Colony Count: NO GROWTH
Culture: NO GROWTH

## 2011-05-20 LAB — SEDIMENTATION RATE: Sed Rate: 98 — ABNORMAL HIGH

## 2011-05-20 LAB — C-REACTIVE PROTEIN: CRP: 15 — ABNORMAL HIGH (ref <0.6)

## 2011-05-20 LAB — APTT: aPTT: 33

## 2011-05-20 LAB — URINE MICROSCOPIC-ADD ON

## 2011-05-20 LAB — H1N1 SCREEN (PCR): H1N1 Virus Scrn: NOT DETECTED

## 2011-05-20 LAB — MAGNESIUM: Magnesium: 2.3

## 2011-05-20 LAB — ANTI-NEUTROPHIL ANTIBODY

## 2011-05-20 LAB — PHOSPHORUS: Phosphorus: 3

## 2011-05-20 LAB — CORTISOL: Cortisol, Plasma: 13.4

## 2011-05-20 LAB — ANA: Anti Nuclear Antibody(ANA): NEGATIVE

## 2011-05-20 LAB — HEPATITIS PANEL, ACUTE: HCV Ab: NEGATIVE

## 2011-05-21 LAB — COMPREHENSIVE METABOLIC PANEL
ALT: 39 — ABNORMAL HIGH
AST: 22
BUN: 15
CO2: 25
CO2: 27
Calcium: 9.5
Calcium: 9.7
Chloride: 104
Creatinine, Ser: 0.7
Creatinine, Ser: 0.75
GFR calc Af Amer: >60
GFR calc non Af Amer: >60
GFR calc non Af Amer: >60
Glucose, Bld: 109 — ABNORMAL HIGH
Total Bilirubin: 0.7
Total Bilirubin: 0.8

## 2011-05-21 LAB — BASIC METABOLIC PANEL
BUN: 13
Calcium: 9
Calcium: 9.6
Chloride: 103
Chloride: 104
Creatinine, Ser: 0.59
Creatinine, Ser: 0.61
GFR calc Af Amer: >60
GFR calc Af Amer: >60
GFR calc non Af Amer: >60
Glucose, Bld: 109 — ABNORMAL HIGH
Potassium: 4
Sodium: 138
Sodium: 140
Sodium: 141

## 2011-05-21 LAB — MAGNESIUM
Magnesium: 2.4
Magnesium: 2.5

## 2011-05-21 LAB — POCT I-STAT 3, ART BLOOD GAS (G3+)
pCO2 arterial: 28.1 — ABNORMAL LOW
pH, Arterial: 7.47 — ABNORMAL HIGH
pO2, Arterial: 152 — ABNORMAL HIGH

## 2011-05-21 LAB — URINALYSIS, ROUTINE W REFLEX MICROSCOPIC
Bilirubin Urine: NEGATIVE
Nitrite: POSITIVE — AB
Specific Gravity, Urine: 1.025
Urobilinogen, UA: 2 — ABNORMAL HIGH

## 2011-05-21 LAB — GLUCOSE, CAPILLARY
Glucose-Capillary: 101 — ABNORMAL HIGH
Glucose-Capillary: 104 — ABNORMAL HIGH
Glucose-Capillary: 110 — ABNORMAL HIGH
Glucose-Capillary: 113 — ABNORMAL HIGH
Glucose-Capillary: 118 — ABNORMAL HIGH
Glucose-Capillary: 123 — ABNORMAL HIGH
Glucose-Capillary: 124 — ABNORMAL HIGH
Glucose-Capillary: 126 — ABNORMAL HIGH
Glucose-Capillary: 135 — ABNORMAL HIGH
Glucose-Capillary: 138 — ABNORMAL HIGH
Glucose-Capillary: 150 — ABNORMAL HIGH
Glucose-Capillary: 86

## 2011-05-21 LAB — CBC
HCT: 31 — ABNORMAL LOW
HCT: 34.1 — ABNORMAL LOW
Hemoglobin: 10.1 — ABNORMAL LOW
Hemoglobin: 10.7 — ABNORMAL LOW
Hemoglobin: 11.6 — ABNORMAL LOW
MCHC: 34.5
MCHC: 35.3
MCV: 90.4
MCV: 90.5
MCV: 90.6
MCV: 91.7
Platelets: 598 — ABNORMAL HIGH
RBC: 3.21 — ABNORMAL LOW
RBC: 3.38 — ABNORMAL LOW
RBC: 3.63 — ABNORMAL LOW
RBC: 3.78 — ABNORMAL LOW
RDW: 13.6
WBC: 11.3 — ABNORMAL HIGH
WBC: 9.9

## 2011-05-21 LAB — URINE MICROSCOPIC-ADD ON

## 2011-05-21 LAB — PHOSPHORUS: Phosphorus: 5.4 — ABNORMAL HIGH

## 2011-05-21 LAB — URINE CULTURE

## 2013-04-19 ENCOUNTER — Emergency Department (HOSPITAL_COMMUNITY)
Admission: EM | Admit: 2013-04-19 | Discharge: 2013-04-20 | Disposition: A | Discharge status: Home / Self Care | Payer: Self-pay | Attending: Emergency Medicine | Admitting: Emergency Medicine

## 2013-04-19 ENCOUNTER — Encounter (HOSPITAL_COMMUNITY): Payer: Self-pay | Admitting: *Deleted

## 2013-04-19 DIAGNOSIS — Z888 Allergy status to other drugs, medicaments and biological substances status: Secondary | ICD-10-CM | POA: Insufficient documentation

## 2013-04-19 DIAGNOSIS — F17200 Nicotine dependence, unspecified, uncomplicated: Secondary | ICD-10-CM | POA: Insufficient documentation

## 2013-04-19 DIAGNOSIS — Z889 Allergy status to unspecified drugs, medicaments and biological substances status: Secondary | ICD-10-CM

## 2013-04-19 DIAGNOSIS — L299 Pruritus, unspecified: Secondary | ICD-10-CM | POA: Insufficient documentation

## 2013-04-19 DIAGNOSIS — T360X5A Adverse effect of penicillins, initial encounter: Secondary | ICD-10-CM | POA: Insufficient documentation

## 2013-04-19 DIAGNOSIS — R21 Rash and other nonspecific skin eruption: Secondary | ICD-10-CM | POA: Insufficient documentation

## 2013-04-19 MED ORDER — FAMOTIDINE 20 MG PO TABS: 20.0000 mg | ORAL_TABLET | Freq: Two times a day (BID) | ORAL | Status: AC

## 2013-04-19 MED ORDER — FAMOTIDINE 20 MG PO TABS
20.0000 mg | ORAL_TABLET | Freq: Once | ORAL | Status: AC
  Administered 2013-04-19: 20 mg via ORAL
  Filled 2013-04-19: qty 1

## 2013-04-19 MED ORDER — DIPHENHYDRAMINE HCL 25 MG PO CAPS
25.0000 mg | ORAL_CAPSULE | Freq: Once | ORAL | Status: AC
  Administered 2013-04-19: 25 mg via ORAL
  Filled 2013-04-19: qty 1

## 2013-04-19 MED ORDER — PREDNISONE 20 MG PO TABS
60.0000 mg | ORAL_TABLET | Freq: Once | ORAL | Status: AC
  Administered 2013-04-19: 60 mg via ORAL
  Filled 2013-04-19: qty 3

## 2013-04-19 MED ORDER — DIPHENHYDRAMINE HCL 25 MG PO TABS: 25.0000 mg | ORAL_TABLET | Freq: Four times a day (QID) | ORAL | Status: AC

## 2013-04-19 NOTE — ED Notes (Addendum)
Been having allergies since last Friday. Generalized rash on body. Had a cold and [facility]; prescribed antibiotic and given 2 injections; don't remember medication.

## 2013-04-19 NOTE — ED Provider Notes (Signed)
CSN: [id number]     Arrival date & time 04/19/13  2219 History  This chart was scribed for non-physician practitioner Fayrene Helper, PA-C, working with Ward Givens, MD by Dorothey Baseman, ED Scribe. This patient was seen in room TR07C/TR07C and the patient's care was started at 10:46 PM.    Chief Complaint  Patient presents with  . Allergies    The history is provided by the patient. A language interpreter was used.   HPI Comments: Laura Nash is a 32 y.o. female who presents to the Emergency Department complaining of a rash on her whole body onset 3-4 days ago following taking amoxicillin for a throat infection that has been successfully treated. She reports associated itching and pain with scratching. She states that she applied Benadryl cream that temporarily relieved itching, but not the rash. Patient denies any difficulty breathing, fever, or headache. A language interpreter was used.   No past medical history on file. Past Surgical History  Procedure Laterality Date  . Throat surgery      laser surgery   No family history on file. History  Substance Use Topics  . Smoking status: Current Some Day Smoker  . Smokeless tobacco: Not on file  . Alcohol Use: No   OB History   Grav Para Term Preterm Abortions TAB SAB Ect Mult Living                 Review of Systems  Constitutional: Negative for fever.  Respiratory: Negative for shortness of breath.   Skin: Positive for rash.  Neurological: Negative for headaches.  All other systems reviewed and are negative.    Allergies  Review of patient's allergies indicates no known allergies.  Home Medications  No current outpatient prescriptions on file.  Triage Vitals: BP 124/69  Pulse 83  Temp(Src) 98.2 F (36.8 C) (Oral)  Resp 20  SpO2 99%  LMP 02/19/2013  Physical Exam  Nursing note and vitals reviewed. Constitutional: She is oriented to person, place, and time. She appears well-developed and well-nourished. No distress.   HENT:  Head: Normocephalic and atraumatic.  Mouth/Throat: Oropharynx is clear and moist.  Uvula is midline. No tonsilar swelling or exudate.   Eyes: Conjunctivae are normal.  Neck: Normal range of motion. Neck supple.  Cardiovascular: Normal rate, regular rhythm and normal heart sounds.   Pulmonary/Chest: Effort normal and breath sounds normal. No respiratory distress.  Musculoskeletal: Normal range of motion.  Neurological: She is alert and oriented to person, place, and time.  Skin: Skin is warm and dry.  Urticarial rash throughout body without petechiae or vesicular lesion.   Psychiatric: She has a normal mood and affect. Her behavior is normal.    ED Course  Procedures (including critical care time)  Medications  predniSONE (DELTASONE) tablet 60 mg (60 mg Oral Given 04/19/13 2307)  famotidine (PEPCID) tablet 20 mg (20 mg Oral Given 04/19/13 2307)  diphenhydrAMINE (BENADRYL) capsule 25 mg (25 mg Oral Given 04/19/13 2307)    DIAGNOSTIC STUDIES: Oxygen Saturation is 99% on room air, normal by my interpretation.    COORDINATION OF CARE: 10:53PM- Discussed that symptoms are likely due to an allergic reaction to amoxicillin. Will order medications to treat symptoms and advised that symptoms should improve within 48 hours. Advised patient to follow up if there are any new or worsening symptoms, especially fever or shortness of breath. Discussed treatment plan with patient at bedside and patient verbalized agreement.     Labs Review Labs Reviewed - No  data to display Imaging Review No results found.  MDM   1. Drug allergy    BP 124/69  Pulse 83  Temp(Src) 98.2 F (36.8 C) (Oral)  Resp 20  SpO2 99%  LMP 02/19/2013   I personally performed the services described in this documentation, which was scribed in my presence. The recorded information has been reviewed and is accurate.     Fayrene Helper, PA-C 04/22/13 [phone]

## 2013-04-23 NOTE — ED Provider Notes (Signed)
Medical screening examination/treatment/procedure(s) were performed by non-physician practitioner and as supervising physician I was immediately available for consultation/collaboration. Maximina Pirozzi, MD, FACEP   Telissa Palmisano L Delos Klich, MD 04/23/13 0730 

## 2016-09-18 ENCOUNTER — Ambulatory Visit (INDEPENDENT_AMBULATORY_CARE_PROVIDER_SITE_OTHER): Payer: Self-pay | Admitting: Emergency Medicine

## 2016-09-18 VITALS — BP 120/70 | HR 87 | Temp 98.0°F | Ht 62.0 in | Wt 209.4 lb

## 2016-09-18 DIAGNOSIS — L089 Local infection of the skin and subcutaneous tissue, unspecified: Secondary | ICD-10-CM

## 2016-09-18 DIAGNOSIS — Z23 Encounter for immunization: Secondary | ICD-10-CM

## 2016-09-18 DIAGNOSIS — L723 Sebaceous cyst: Secondary | ICD-10-CM

## 2016-09-18 MED ORDER — CEPHALEXIN 500 MG PO CAPS: 500.0000 mg | ORAL_CAPSULE | Freq: Three times a day (TID) | ORAL | 0 refills | Status: AC

## 2016-09-18 NOTE — Progress Notes (Signed)
Laura Nash 36 y.o.   Chief Complaint  Patient presents with  . Leg Pain    X5 days lump on back of left leg    HISTORY OF PRESENT ILLNESS: [name] complaining of painful red lump to back of left upper thigh that started with a pimple last Laura.  HPI   Prior to Admission medications   Medication Sig Start Date End Date Taking? Authorizing Provider  amoxicillin (AMOXIL) 500 MG capsule Take 500 mg by mouth 3 (three) times daily. For 10 days; Start date unknown   Yes Historical Provider, MD  diphenhydrAMINE (BENADRYL) 25 MG tablet Take 1 tablet (25 mg total) by mouth every 6 (six) hours. 04/19/13  Yes Fayrene HelperBowie Tran, PA-C  famotidine (PEPCID) 20 MG tablet Take 1 tablet (20 mg total) by mouth 2 (two) times daily. 04/19/13  Yes Fayrene HelperBowie Tran, PA-C  ibuprofen (ADVIL,MOTRIN) 800 MG tablet Take 800 mg by mouth every 8 (eight) hours as needed for pain.   Yes Historical Provider, MD    No Known Allergies  Patient Active Problem List   Diagnosis Date Noted  . ACUTE BRONCHITIS 11/28/2008  . ALLERGIC RHINITIS 11/28/2008  . GERD 11/28/2008  . DYSPNEA 10/19/2008    No past medical history on file.  Past Surgical History:  Procedure Laterality Date  . THROAT SURGERY     laser surgery    Social History   Social History  . Marital status: Single    Spouse name: N/A  . Number of children: N/A  . Years of education: N/A   Occupational History  . Not on file.   Social History Main Topics  . Smoking status: Current Some Day Smoker  . Smokeless tobacco: Not on file  . Alcohol use No  . Drug use: Unknown  . Sexual activity: Not on file   Other Topics Concern  . Not on file   Social History Narrative  . No narrative on file    No family history on file.   Review of Systems  Constitutional: Negative.  Negative for chills and fever.  HENT: Negative.   Eyes: Negative.   Respiratory: Negative.  Negative for cough and shortness of breath.   Cardiovascular:  Negative.  Negative for chest pain and palpitations.  Gastrointestinal: Negative.  Negative for nausea and vomiting.  Genitourinary: Negative.   Musculoskeletal: Negative for joint pain and myalgias.  Skin: Positive for rash. Negative for itching.  Neurological: Negative for dizziness and headaches.  Endo/Heme/Allergies: Does not bruise/bleed easily.  All other systems reviewed and are negative.  Vitals:   09/18/16 1426  BP: 120/70  Pulse: 87  Temp: 98 F (36.7 C)     Physical Exam  Constitutional: She is oriented to person, place, and time. She appears well-developed and well-nourished.  HENT:  Head: Normocephalic and atraumatic.  Eyes: Conjunctivae and EOM are normal. Pupils are equal, round, and reactive to light.  Neck: Normal range of motion. Neck supple.  Cardiovascular: Normal rate and regular rhythm.   Pulmonary/Chest: Effort normal and breath sounds normal.  Abdominal: Soft. There is no tenderness.  Musculoskeletal: Normal range of motion.  Neurological: She is alert and oriented to person, place, and time. No sensory deficit. She exhibits normal muscle tone.  Skin: Skin is warm and dry. Capillary refill takes less than 2 seconds.  Left upper posterior thigh: +round and hard lump with surrounding erythema, no fluctuation.  Psychiatric: She has a normal mood and affect. Her behavior is normal.  ASSESSMENT & PLAN: Laura Nash was seen today for leg pain.  Diagnoses and all orders for this visit:  Infected sebaceous cyst  Need for prophylactic vaccination and inoculation against influenza -     Flu Vaccine QUAD 36+ mos IM  Other orders -     cephALEXin (KEFLEX) 500 MG capsule; Take 1 capsule (500 mg total) by mouth 3 (three) times daily.    Patient Instructions       IF you received an x-ray today, you will receive an invoice from [facility]. Please contact [facility] at [phone] with questions or concerns regarding your invoice.    IF you received labwork today, you will receive an invoice from [facility]. Please contact [facility] at [phone] with questions or concerns regarding your invoice.   Our billing staff will not be able to assist you with questions regarding bills from these companies.  You will be contacted with the lab results as soon as they are available. The fastest way to get your results is to activate your My Chart account. Instructions are located on the last page of this paperwork. If you have not heard from [place] regarding the results in 2 weeks, please contact this office.      Quiste epidrmico (Epidermal Cyst) Un quiste epidrmico es un bulto pequeo, no doloroso, que se encuentra debajo de la piel. Tambin se lo puede llamar quiste de inclusin epidrmica o quiste infundibular. El quiste contiene una sustancia blanca griscea con mal olor [place]). Es importante que no apriete los quistes epidrmicos. Estos quistes por lo general no son dainos (son benignos), pero se pueden infectar. Entre los sntomas de infeccin, se incluyen los siguientes:  Enrojecimiento.  Inflamacin.  Dolor a Insurance claims handler.  Calor.  Grant Ruts.  Una sustancia blanca griscea, con mal olor, que sale del [place].  Pus que sale del quiste. CUIDADOS EN EL HOGAR  Tome los medicamentos de venta libre y los recetados solamente como se lo haya indicado el mdico.  Si le recetaron un antibitico, tmelo como se lo haya indicado el mdico. No deje de usar el antibitico aunque comience a Actor.  Mantenga el rea que rodea el quiste limpia y [place].  Use ropa suelta y seca.  No intente apretar el quiste.  Evite tocar el quiste.  Revise el [employer] para detectar signos de infeccin.  Concurra a todas las visitas de control como se lo haya indicado el mdico. Esto es importante. PREVENCIN  Use ropa limpia y [place].  Evite usar ropa [place].  Mantenga la piel limpia y seca. Tome una ducha o  un bao [employer].  Lvese el cuerpo con perxido de benzolo cuando tome una ducha o un bao. SOLICITE AYUDA SI:  El quiste presenta sntomas de infeccin.  El problema no mejora, o empeora.  Tiene un quiste con un aspecto diferente de los otros quistes que ha tenido.  Tiene fiebre. SOLICITE AYUDA DE INMEDIATO SI:  Aparece enrojecimiento en el quiste que se propaga hacia el rea que lo rodea. Esta informacin no tiene Theme park manager el consejo del mdico. Asegrese de hacerle al mdico cualquier pregunta que tenga. Document Released: 08/05/2005 Document Revised: 02/04/2012 Document Reviewed: 06/07/2015 Elsevier Interactive Patient Education  2017 Elsevier Inc.      Edwina Barth, MD [facility]

## 2016-09-18 NOTE — Patient Instructions (Addendum)
     IF you received an x-ray today, you will receive an invoice from [facility]. Please contact [facility] at [phone] with questions or concerns regarding your invoice.   IF you received labwork today, you will receive an invoice from [facility]. Please contact [facility] at [phone] with questions or concerns regarding your invoice.   Our billing staff will not be able to assist you with questions regarding bills from these companies.  You will be contacted with the lab results as soon as they are available. The fastest way to get your results is to activate your My Chart account. Instructions are located on the last page of this paperwork. If you have not heard from us regarding the results in 2 weeks, please contact this office.      Quiste epidrmico (Epidermal Cyst) Un quiste epidrmico es un bulto pequeo, no doloroso, que se encuentra debajo de la piel. Tambin se lo puede llamar quiste de inclusin epidrmica o quiste infundibular. El quiste contiene una sustancia blanca griscea con mal olor [place] Es importante que no apriete los quistes epidrmicos. Estos quistes por lo general no son dainos (son benignos), pero se pueden infectar. Entre los sntomas de infeccin, se incluyen los siguientes:  Enrojecimiento.  Inflamacin.  Dolor a Insurance claims handlerla palpacin.  Calor.  Grant RutsFiebre.  Una sustancia blanca griscea, con mal olor, que sale del [place].  Pus que sale del quiste. CUIDADOS EN EL HOGAR  Tome los medicamentos de venta libre y los recetados solamente como se lo haya indicado el mdico.  Si le recetaron un antibitico, tmelo como se lo haya indicado el mdico. No deje de usar el antibitico aunque comience a Actorsentirse mejor.  Mantenga el rea que rodea el quiste limpia y [place].  Use ropa suelta y seca.  No intente apretar el quiste.  Evite tocar el quiste.  Revise el Toys 'R' Usquiste todos los das para detectar signos de infeccin.  Concurra a todas  las visitas de control como se lo haya indicado el mdico. Esto es importante. PREVENCIN  Use ropa limpia y [place].  Evite usar ropa [place].  Mantenga la piel limpia y seca. Tome una ducha o un bao Car[employer] los das.  Lvese el cuerpo con perxido de benzolo cuando tome una ducha o un bao. SOLICITE AYUDA SI:  El quiste presenta sntomas de infeccin.  El problema no mejora, o empeora.  Tiene un quiste con un aspecto diferente de los otros quistes que ha tenido.  Tiene fiebre. SOLICITE AYUDA DE INMEDIATO SI:  Aparece enrojecimiento en el quiste que se propaga hacia el rea que lo rodea. Esta informacin no tiene Theme park managercomo fin reemplazar el consejo del mdico. Asegrese de hacerle al mdico cualquier pregunta que tenga. Document Released: 08/05/2005 Document Revised: 02/04/2012 Document Reviewed: 06/07/2015 Elsevier Interactive Patient Education  2017 [employer]

## 2017-06-24 ENCOUNTER — Other Ambulatory Visit: Payer: Self-pay

## 2017-06-24 ENCOUNTER — Ambulatory Visit (INDEPENDENT_AMBULATORY_CARE_PROVIDER_SITE_OTHER): Payer: Self-pay | Admitting: Emergency Medicine

## 2017-06-24 ENCOUNTER — Encounter: Payer: Self-pay | Admitting: Emergency Medicine

## 2017-06-24 VITALS — BP 102/62 | HR 90 | Temp 98.7°F | Resp 16 | Ht 60.5 in | Wt 208.4 lb

## 2017-06-24 DIAGNOSIS — M545 Low back pain, unspecified: Secondary | ICD-10-CM

## 2017-06-24 DIAGNOSIS — L309 Dermatitis, unspecified: Secondary | ICD-10-CM

## 2017-06-24 DIAGNOSIS — Z23 Encounter for immunization: Secondary | ICD-10-CM

## 2017-06-24 DIAGNOSIS — R102 Pelvic and perineal pain: Secondary | ICD-10-CM

## 2017-06-24 LAB — POCT URINALYSIS DIP (MANUAL ENTRY)
Bilirubin, UA: NEGATIVE
GLUCOSE UA: NEGATIVE mg/dL
Ketones, POC UA: NEGATIVE mg/dL
Leukocytes, UA: NEGATIVE
Nitrite, UA: NEGATIVE
Protein Ur, POC: NEGATIVE mg/dL
RBC UA: NEGATIVE
Spec Grav, UA: 1.02 (ref 1.010–1.025)
UROBILINOGEN UA: 0.2 U/dL
pH, UA: 7 (ref 5.0–8.0)

## 2017-06-24 LAB — POCT CBC
Granulocyte percent: 61.5 %G (ref 37–80)
HCT, POC: 45.3 % (ref 37.7–47.9)
HEMOGLOBIN: 15.4 g/dL (ref 12.2–16.2)
Lymph, poc: 2.1 (ref 0.6–3.4)
MCH: 30.7 pg (ref 27–31.2)
MCHC: 34 g/dL (ref 31.8–35.4)
MCV: 90 fL (ref 80–97)
MID (cbc): 1.7 — AB (ref 0–0.9)
MPV: 8.9 fL (ref 0–99.8)
PLATELET COUNT, POC: 204 10*3/uL (ref 142–424)
POC Granulocyte: 6 (ref 2–6.9)
POC LYMPH PERCENT: 21.3 %L (ref 10–50)
POC MID %: 17.2 % — AB (ref 0–12)
RBC: 5.03 M/uL (ref 4.04–5.48)
RDW, POC: 12.5 %
WBC: 9.7 10*3/uL (ref 4.6–10.2)

## 2017-06-24 LAB — POCT URINE PREGNANCY: Preg Test, Ur: NEGATIVE

## 2017-06-24 MED ORDER — TRAMADOL HCL 50 MG PO TABS: 50.0000 mg | ORAL_TABLET | Freq: Three times a day (TID) | ORAL | 0 refills | Status: AC | PRN

## 2017-06-24 MED ORDER — TRIAMCINOLONE ACETONIDE 0.1 % EX CREA: 1.0000 "application " | TOPICAL_CREAM | Freq: Two times a day (BID) | CUTANEOUS | 0 refills | Status: AC

## 2017-06-24 NOTE — Patient Instructions (Addendum)
IF you received an x-ray today, you will receive an invoice from [facility]. Please contact [facility] at [phone] with questions or concerns regarding your invoice.   IF you received labwork today, you will receive an invoice from [facility]. Please contact [facility] at [phone] with questions or concerns regarding your invoice.   Our billing staff will not be able to assist you with questions regarding bills from these companies.  You will be contacted with the lab results as soon as they are available. The fastest way to get your results is to activate your My Chart account. Instructions are located on the last page of this paperwork. If you have not heard from us regarding the results in 2 weeks, please contact this office.     Dolor plvico en la mujer (Pelvic Pain, Female) El dolor plvico se percibe en la parte baja del abdomen, por debajo del ombligo y Eaton Corporationentre las caderas. El dolor puede comenzar de forma repentina (agudo), reaparecer (recurrente) o durar mucho tiempo (crnico). Se considera que el dolor plvico que dura ms de seis meses es crnico. El dolor plvico puede tener numerosas causas. A veces, la causa no se conoce. CUIDADOS EN EL HOGAR Tome los medicamentos de venta libre y los recetados solamente como se lo haya indicado el mdico. Haga reposo como se lo haya indicado el mdico. No tenga relaciones sexuales si le causan dolor. Lleve un registro del dolor plvico. Escriba los siguientes datos: Cundo comenz Chief Technology Officerel dolor. La ubicacin del dolor. Qu cosas parecen Teacher, English as a foreign languagealiviar o intensificar el dolor, como los alimentos o la [place] Islandmenstruacin. Los sntomas que tiene junto con Chief Technology Officerel dolor. Concurra a todas las visitas de control como se lo haya indicado el mdico. Esto es importante. SOLICITE AYUDA SI: Los medicamentos no Tourist information centre managerle alivian el dolor. El dolor reaparece. Aparecen nuevos sntomas. Tiene secrecin o sangrado vaginal atpico. Tiene fiebre o siente  escalofros. Tiene dificultad para defecar (estreimiento). Observa sangre en la orina o en la materia fecal. La orina tiene mal olor. Se siente mareada o dbil. SOLICITE AYUDA DE INMEDIATO SI: Siente un dolor repentino que es muy intenso. El dolor contina empeorando. El dolor es muy intenso y, [place]ems, tiene alguno de los siguientes sntomas: Grant RutsFiebre. Ganas de vomitar (nuseas). Vmitos. Wendall StadeSuda mucho. Se desmaya (pierde el conocimiento). Esta informacin no tiene Theme park managercomo fin reemplazar el consejo del mdico. Asegrese de hacerle al mdico cualquier pregunta que tenga. Document Released: 02/04/2012 Document Revised: 08/26/2014 Document Reviewed: 05/26/2015 Elsevier Interactive Patient Education  2018 Elsevier Inc.  Pelvic Pain, Female Pelvic pain is pain in your lower belly (abdomen), below your belly button and between your hips. The pain may start suddenly (acute), keep coming back (recurring), or last a long time (chronic). Pelvic pain that lasts longer than six months is considered chronic. There are many causes of pelvic pain. Sometimes the cause of your pelvic pain is not known. Follow these instructions at home:  Take over-the-counter and prescription medicines only as told by your doctor.  Rest as told by your doctor.  Do not have sex it if hurts.  Keep a journal of your pelvic pain. Write down: ? When the pain started. ? Where the pain is located. ? What seems to make the pain better or worse, such as food or your menstrual cycle. ? Any symptoms you have along with the pain.  Keep all follow-up visits as told by your doctor. This is important. Contact a doctor if:  Medicine does not help your  pain.  Your pain comes back.  You have new symptoms.  You have unusual vaginal discharge or bleeding.  You have a fever or chills.  You are having a hard time pooping (constipation).  You have blood in your pee (urine) or poop (stool).  Your pee smells bad.  You feel weak  or lightheaded. Get help right away if:  You have sudden pain that is very bad.  Your pain continues to get worse.  You have very bad pain and also have any of the following symptoms: ? A fever. ? Feeling stick to your stomach (nausea). ? Throwing up (vomiting). ? Being very sweaty.  You pass out (lose consciousness). This information is not intended to replace advice given to you by your health care provider. Make sure you discuss any questions you have with your health care provider. Document Released: 01/22/2008 Document Revised: 08/30/2015 Document Reviewed: 05/26/2015 Elsevier Interactive Patient Education  [employer]2018 Elsevier Inc.

## 2017-06-24 NOTE — Progress Notes (Signed)
Laura Nash 36 y.o.   Chief Complaint  Patient presents with  . Back Pain    to the pelvic and abdominal x 6 days  . Rash    RIGHT FOOT with itching x 6 months on/off    HISTORY OF PRESENT ILLNESS: [name] complaining of low back pain radiating to pelvic and lower abdominal areas x 1 week; denies pregnancy or any other associated symptoms; eating normal; denies fever, chills, urinary problems, diarrhea, rectal bleeding, trauma/injuries.  HPI   Prior to Admission medications   Medication Sig Start Date End Date Taking? Authorizing Provider  ibuprofen (ADVIL,MOTRIN) 800 MG tablet Take 800 mg by mouth every 8 (eight) hours as needed for pain.   Yes [provider]  diphenhydrAMINE (BENADRYL) 25 MG tablet Take 1 tablet (25 mg total) by mouth every 6 (six) hours. Patient not taking: Reported on 06/24/2017 04/19/13   Fayrene Helperran, [place], PA-C  famotidine (PEPCID) 20 MG tablet Take 1 tablet (20 mg total) by mouth 2 (two) times daily. Patient not taking: Reported on 06/24/2017 04/19/13   Fayrene Helperran, [place], PA-C    No Known Allergies  Patient Active Problem List   Diagnosis Date Noted  . Infected sebaceous cyst 09/18/2016  . ACUTE BRONCHITIS 11/28/2008  . ALLERGIC RHINITIS 11/28/2008  . GERD 11/28/2008  . DYSPNEA 10/19/2008    No past medical history on file.  Past Surgical History:  Procedure Laterality Date  . THROAT SURGERY     laser surgery    Social History   Socioeconomic History  . Marital status: Single    Spouse name: Not on file  . Number of children: Not on file  . Years of education: Not on file  . Highest education level: Not on file  Social Needs  . Financial resource strain: Not on file  . Food insecurity - worry: Not on file  . Food insecurity - inability: Not on file  . Transportation needs - medical: Not on file  . Transportation needs - non-medical: Not on file  Occupational History  . Not on file  Tobacco Use  . Smoking status:  Current Some Day Smoker    Types: Cigarettes  . Smokeless tobacco: Never Used  Substance and Sexual Activity  . Alcohol use: No  . Drug use: No  . Sexual activity: Not on file  Other Topics Concern  . Not on file  Social History Narrative  . Not on file    No family history on file.   Review of Systems  Constitutional: Negative for chills, fever and weight loss.  HENT: Negative.   Eyes: Negative.   Respiratory: Negative.  Negative for cough, hemoptysis and shortness of breath.   Cardiovascular: Negative.  Negative for chest pain, palpitations and leg swelling.  Gastrointestinal: Positive for abdominal pain. Negative for blood in stool, constipation, diarrhea, melena, nausea and vomiting.  Genitourinary: Negative.  Negative for dysuria, flank pain, frequency, hematuria and urgency.  Musculoskeletal: Negative.   Skin: Positive for rash (right foot).  Neurological: Negative.  Negative for dizziness and headaches.  Endo/Heme/Allergies: Negative.   All other systems reviewed and are negative.   Vitals:   06/24/17 1416  BP: 102/62  Pulse: 90  Resp: 16  Temp: 98.7 F (37.1 C)  SpO2: 97%    Physical Exam  Constitutional: She is oriented to person, place, and time. She appears well-developed.  Obese.  HENT:  Head: Normocephalic and atraumatic.  Eyes: Conjunctivae and EOM are normal. Pupils are  equal, round, and reactive to light.  Neck: Normal range of motion. Neck supple.  Cardiovascular: Normal rate, regular rhythm and normal heart sounds.  Pulmonary/Chest: Effort normal and breath sounds normal.  Abdominal: Soft. Bowel sounds are normal. She exhibits no distension and no mass. There is no tenderness. There is no rebound and no guarding.  Musculoskeletal: Normal range of motion.       Lumbar back: She exhibits normal range of motion, no tenderness, no bony tenderness, no swelling, no pain, no spasm and normal pulse.  Neurological: She is alert and oriented to person,  place, and time. No sensory deficit. She exhibits normal muscle tone.  Skin: Skin is warm and dry. Capillary refill takes less than 2 seconds. Rash (right foot (dorsum): +dermatitis) noted.  Psychiatric: She has a normal mood and affect. Her behavior is normal.  Vitals reviewed.  Results for orders placed or performed in visit on 06/24/17 (from the past 24 hour(s))  POCT urine pregnancy     Status: None   Collection Time: 06/24/17  2:57 PM  Result Value Ref Range   Preg Test, Ur Negative Negative  POCT urinalysis dipstick     Status: None   Collection Time: 06/24/17  2:57 PM  Result Value Ref Range   Color, UA yellow yellow   Clarity, UA clear clear   Glucose, UA negative negative mg/dL   Bilirubin, UA negative negative   Ketones, POC UA negative negative mg/dL   Spec Grav, UA 4.6961.020 2.9521.010 - 1.025   Blood, UA negative negative   pH, UA 7.0 5.0 - 8.0   Protein Ur, POC negative negative mg/dL   Urobilinogen, UA 0.2 0.2 or 1.0 E.U./dL   Nitrite, UA Negative Negative   Leukocytes, UA Negative Negative  POCT CBC     Status: Abnormal   Collection Time: 06/24/17  3:04 PM  Result Value Ref Range   WBC 9.7 4.6 - 10.2 K/uL   Lymph, poc 2.1 0.6 - 3.4   POC LYMPH PERCENT 21.3 10 - 50 %L   MID (cbc) 1.7 (A) 0 - 0.9   POC MID % 17.2 (A) 0 - 12 %M   POC Granulocyte 6.0 2 - 6.9   Granulocyte percent 61.5 37 - 80 %G   RBC 5.03 4.04 - 5.48 M/uL   Hemoglobin 15.4 12.2 - 16.2 g/dL   HCT, POC 84.145.3 32.437.7 - 47.9 %   MCV 90.0 80 - 97 fL   MCH, POC 30.7 27 - 31.2 pg   MCHC 34.0 31.8 - 35.4 g/dL   RDW, POC 40.112.5 %   Platelet Count, POC 204 142 - 424 K/uL   MPV 8.9 0 - 99.8 fL     ASSESSMENT & PLAN: Rosey Batheresa was seen today for back pain and rash.  Diagnoses and all orders for this visit:  Pelvic pain -     POCT CBC -     POCT urine pregnancy -     POCT urinalysis dipstick -     Comprehensive metabolic panel -     US Pelvis Complete; Future  Dermatitis of right foot  Acute bilateral low  back pain without sciatica  Other orders -     triamcinolone cream (KENALOG) 0.1 %; Apply 1 application 2 (two) times daily topically. -     traMADol (ULTRAM) 50 MG tablet; Take 1 tablet (50 mg total) every 8 (eight) hours as needed by mouth.    Patient Instructions       IF you  received an x-ray today, you will receive an invoice from [facility]. Please contact [facility] at [phone] with questions or concerns regarding your invoice.   IF you received labwork today, you will receive an invoice from [place]. Please contact [facility] at [phone] with questions or concerns regarding your invoice.   Our billing staff will not be able to assist you with questions regarding bills from these companies.  You will be contacted with the lab results as soon as they are available. The fastest way to get your results is to activate your My Chart account. Instructions are located on the last page of this paperwork. If you have not heard from [place] regarding the results in 2 weeks, please contact this office.     Dolor plvico en la mujer (Pelvic Pain, Female) El dolor plvico se percibe en la parte baja del abdomen, por debajo del ombligo y [employer]. El dolor puede comenzar de forma repentina (agudo), reaparecer (recurrente) o durar mucho tiempo (crnico). Se considera que el dolor plvico que dura ms de seis meses es crnico. El dolor plvico puede tener numerosas causas. A veces, la causa no se conoce. CUIDADOS EN EL HOGAR Tome los medicamentos de venta libre y los recetados solamente como se lo haya indicado el mdico. Haga reposo como se lo haya indicado el mdico. No tenga relaciones sexuales si le causan dolor. Lleve un registro del dolor plvico. Escriba los siguientes datos: Cundo comenz Chief Technology Officer. La ubicacin del dolor. Qu cosas parecen Teacher, English as a foreign language, como los alimentos o la Friendly. Los sntomas que tiene junto con Water quality scientist. Concurra a todas las visitas de control como se lo haya indicado el mdico. Esto es importante. SOLICITE AYUDA SI: Los medicamentos no Tourist information centre manager. El dolor reaparece. Aparecen nuevos sntomas. Tiene secrecin o sangrado vaginal atpico. Tiene fiebre o siente escalofros. Tiene dificultad para defecar (estreimiento). Observa sangre en la orina o en la materia fecal. La orina tiene mal olor. Se siente mareada o dbil. SOLICITE AYUDA DE INMEDIATO SI: Siente un dolor repentino que es muy intenso. El dolor contina empeorando. El dolor es muy intenso y, [place], tiene alguno de los siguientes sntomas: Grant Ruts. Ganas de vomitar (nuseas). Vmitos. Wendall Stade. Se desmaya (pierde el conocimiento). Esta informacin no tiene Theme park manager el consejo del mdico. Asegrese de hacerle al mdico cualquier pregunta que tenga. Document Released: 02/04/2012 Document Revised: 08/26/2014 Document Reviewed: 05/26/2015 Elsevier Interactive Patient Education  2018 Elsevier Inc.  Pelvic Pain, Female Pelvic pain is pain in your lower belly (abdomen), below your belly button and between your hips. The pain may start suddenly (acute), keep coming back (recurring), or last a long time (chronic). Pelvic pain that lasts longer than six months is considered chronic. There are many causes of pelvic pain. Sometimes the cause of your pelvic pain is not known. Follow these instructions at home:  Take over-the-counter and prescription medicines only as told by your doctor.  Rest as told by your doctor.  Do not have sex it if hurts.  Keep a journal of your pelvic pain. Write down: ? When the pain started. ? Where the pain is located. ? What seems to make the pain better or worse, such as food or your menstrual cycle. ? Any symptoms you have along with the pain.  Keep all follow-up visits as told by your doctor. This is important. Contact a doctor if:  Medicine does not help your  pain.  Your pain  comes back.  You have new symptoms.  You have unusual vaginal discharge or bleeding.  You have a fever or chills.  You are having a hard time pooping (constipation).  You have blood in your pee (urine) or poop (stool).  Your pee smells bad.  You feel weak or lightheaded. Get help right away if:  You have sudden pain that is very bad.  Your pain continues to get worse.  You have very bad pain and also have any of the following symptoms: ? A fever. ? Feeling stick to your stomach (nausea). ? Throwing up (vomiting). ? Being very sweaty.  You pass out (lose consciousness). This information is not intended to replace advice given to you by your health care provider. Make sure you discuss any questions you have with your health care provider. Document Released: 01/22/2008 Document Revised: 08/30/2015 Document Reviewed: 05/26/2015 Elsevier Interactive Patient Education  2018 Elsevier Inc.      Edwina Barth, MD [facility]

## 2017-06-24 NOTE — Progress Notes (Signed)
Laura Nash   

## 2017-06-24 NOTE — Addendum Note (Signed)
Addended by: Georg RuddleJOYCE, Omran Keelin A on: 06/24/2017 04:40 PM   Modules accepted: Orders

## 2017-06-25 ENCOUNTER — Encounter: Payer: Self-pay | Admitting: Radiology

## 2017-06-25 LAB — COMPREHENSIVE METABOLIC PANEL
A/G RATIO: 1.6 (ref 1.2–2.2)
ALBUMIN: 4.4 g/dL (ref 3.5–5.5)
ALK PHOS: 90 IU/L (ref 39–117)
ALT: 37 IU/L — ABNORMAL HIGH (ref 0–32)
AST: 26 IU/L (ref 0–40)
BILIRUBIN TOTAL: 0.3 mg/dL (ref 0.0–1.2)
BUN / CREAT RATIO: 20 (ref 9–23)
BUN: 11 mg/dL (ref 6–20)
CHLORIDE: 101 mmol/L (ref 96–106)
CO2: 22 mmol/L (ref 20–29)
Calcium: 9.9 mg/dL (ref 8.7–10.2)
Creatinine, Ser: 0.54 mg/dL — ABNORMAL LOW (ref 0.57–1.00)
GFR calc non Af Amer: 124 mL/min/{1.73_m2} (ref >59)
GFR, EST AFRICAN AMERICAN: 142 mL/min/{1.73_m2} (ref >59)
GLOBULIN, TOTAL: 2.8 g/dL (ref 1.5–4.5)
GLUCOSE: 128 mg/dL — AB (ref 65–99)
POTASSIUM: 4.1 mmol/L (ref 3.5–5.2)
SODIUM: 138 mmol/L (ref 134–144)
TOTAL PROTEIN: 7.2 g/dL (ref 6.0–8.5)

## 2017-07-08 ENCOUNTER — Telehealth: Payer: Self-pay | Admitting: Emergency Medicine

## 2017-07-08 DIAGNOSIS — R102 Pelvic and perineal pain: Secondary | ICD-10-CM

## 2017-07-08 NOTE — Telephone Encounter (Signed)
Copied from CRM #[id number]. >> Jul 08, 2017 12:14 PM Raquel SarnaHayes, Bonni G wrote: [facility] [place] [phone]- [phone]  Order in Epic revised- needs to be changed to [id number] ultrasound pelvic complete with transvaginal

## 2017-07-08 NOTE — Telephone Encounter (Signed)
Done. Thanks.

## 2017-07-08 NOTE — Telephone Encounter (Signed)
Please see note below. 

## 2017-07-09 ENCOUNTER — Other Ambulatory Visit: Payer: Self-pay

## 2017-07-23 ENCOUNTER — Other Ambulatory Visit: Payer: Self-pay

## 2017-07-25 ENCOUNTER — Other Ambulatory Visit: Payer: Self-pay

## 2017-07-25 ENCOUNTER — Ambulatory Visit: Payer: Self-pay | Admitting: Emergency Medicine

## 2019-07-29 ENCOUNTER — Encounter (INDEPENDENT_AMBULATORY_CARE_PROVIDER_SITE_OTHER): Payer: Self-pay | Admitting: Nurse Practitioner

## 2019-07-29 ENCOUNTER — Ambulatory Visit (INDEPENDENT_AMBULATORY_CARE_PROVIDER_SITE_OTHER): Admitting: Nurse Practitioner

## 2019-07-29 VITALS — BP 122/80 | HR 90 | Temp 98.8°F | Resp 18 | Ht 63.78 in | Wt 162.4 lb

## 2019-07-29 MED ORDER — MECLIZINE HCL 25 MG OR TABS
25.0000 mg | ORAL_TABLET | Freq: Three times a day (TID) | ORAL | 0 refills | Status: DC | PRN
Start: 2019-07-29 — End: 2019-12-29

## 2019-07-29 NOTE — Assessment & Plan Note (Signed)
Active  Labs ordered  Stay well hydrated  Start Meclizine 25 mg take ad  Advised slow position changes  Follow up prn

## 2019-07-29 NOTE — Progress Notes (Signed)
Encounter Date:  07/29/19  11:13 AM   PCP: No primary care provider on file.  MRN: [id number]  DOB: 03/26/1981       HPI:  Hannah Lloyd is a 38 year old female presents   Chief Complaint   Patient presents with   . Other     38 y/o Pt presents the office with complaints of dizzyness      Patient presented with c/o dizziness  Onset this morning  Hx Migraines  Last episode 2 weeks ago, treated with rest, applying cold compress, and occ taking Tylenol   Stated quit ETOH  3 months ago  LMP 1-2 months  Tubal ligation 2012  Fam hx DM, CAD  Request work note for today     PROBLEM  LIST:  Patient Active Problem List   Diagnosis   . Screening for endocrine, metabolic and immunity disorder   . Dizziness         PAST MEDICAL HISTORY:  Past Medical History:   Diagnosis Date   . Major depressive disorder, single episode    . Migraine        PAST SURGICAL HISTORY:  No past surgical history on file.     FAMILY HISTORY:   Family History   Problem Relation Name Age of Onset   . Hypertension Mother     . Other Mother     . Heart Attack Mother     . Ovarian Cancer Mother     . Breast Cancer Mother     . Hypertension Father     . Cholesterol/Lipid Disorder Father     . Ovarian Cancer Sister     . Breast Cancer Sister           SOCIAL HISTORY:  Social History     Socioeconomic History   . Marital status: Not on file     Spouse name: Not on file   . Number of children: Not on file   . Years of education: Not on file   . Highest education level: Not on file   Occupational History   . Not on file   Social Needs   . Financial resource strain: Not on file   . Food insecurity     Worry: Not on file     Inability: Not on file   . Transportation needs     Medical: Not on file     Non-medical: Not on file   Tobacco Use   . Smoking status: Never Smoker   . Smokeless tobacco: Never Used   Substance and Sexual Activity   . Alcohol use: Yes     Frequency: Monthly or less   . Drug use: Never   . Sexual activity: Not on file   Lifestyle   .  Physical activity     Days per week: Not on file     Minutes per session: Not on file   . Stress: Not on file   Relationships   . Social Wellsite geologist on phone: Not on file     Gets together: Not on file     Attends religious service: Not on file     Active member of club or organization: Not on file     Attends meetings of clubs or organizations: Not on file     Relationship status: Not on file   . Intimate partner violence     Fear of current or ex partner: Not on file  Emotionally abused: Not on file     Physically abused: Not on file     Forced sexual activity: Not on file   Other Topics Concern   . Not on file   Social History Narrative   . Not on file     Social History     Tobacco Use   Smoking Status Never Smoker   Smokeless Tobacco Never Used      Social History     Substance and Sexual Activity   Alcohol Use Yes   . Frequency: Monthly or less      Social History     Substance and Sexual Activity   Drug Use Never          There is no immunization history on file for this patient.      CURRENT  MEDICATIONS:  No current outpatient medications on file prior to visit.     No current facility-administered medications on file prior to visit.      No outpatient medications have been marked as taking for the 07/29/19 encounter (Office Visit) with Doristine Locks, NP.        ALLERGIES:    No Known Allergies       REVIEW OF SYSTEMS:  Review of Systems   All other systems reviewed and are negative.  refer to HPI       PHYSICAL EXAM:   07/29/19  1100   BP: 122/80   Pulse: 90   Resp: 18   Temp: 98.8 F (37.1 C)     Body mass index is 28.07 kg/m.    Ht Readings from Last 1 Encounters:   07/29/19 5' 3.78" (1.62 m)     Wt Readings from Last 1 Encounters:   07/29/19 73.7 kg (162 lb 6.4 oz)           Physical Exam  Vitals signs and nursing note reviewed.   Constitutional:       Appearance: Normal appearance. She is well-developed.   HENT:      Head: Normocephalic and atraumatic.      Right Ear: Tympanic membrane,  ear canal and external ear normal. There is no impacted cerumen.      Left Ear: Tympanic membrane, ear canal and external ear normal. There is no impacted cerumen.      Mouth/Throat:      Mouth: Mucous membranes are moist.      Pharynx: Oropharynx is clear.   Eyes:      Conjunctiva/sclera: Conjunctivae normal.      Pupils: Pupils are equal, round, and reactive to light.   Neck:      Musculoskeletal: Normal range of motion and neck supple.   Cardiovascular:      Rate and Rhythm: Normal rate and regular rhythm.   Pulmonary:      Effort: Pulmonary effort is normal.      Breath sounds: Normal breath sounds.   Abdominal:      Palpations: Abdomen is soft.   Musculoskeletal: Normal range of motion.   Lymphadenopathy:      Cervical: No cervical adenopathy.   Skin:     General: Skin is warm and dry.   Neurological:      Mental Status: She is alert and oriented to person, place, and time.   Psychiatric:         Mood and Affect: Mood normal.         Behavior: Behavior normal.  ASSESSMENT & PLAN:    Talesa was seen today for other.    Diagnoses and all orders for this visit:    Screening for endocrine, metabolic and immunity disorder  Assessment & Plan:  Stable  Labs ordered   Follow up prn results      Orders:  -     CBC w/ Diff Lavender; Future  -     Comprehensive Metabolic Panel Green; Future  -     TSH, Blood - See Instructions; Future  -     Lipid Panel Green Plasma Separator Tube; Future    Dizziness  Assessment & Plan:  Active  Labs ordered  Stay well hydrated  Start Meclizine 25 mg take ad  Advised slow position changes  Follow up prn     Orders:  -     CBC w/ Diff Lavender; Future  -     HCG Quantitative, Blood Green Plasma Separator Tube; Future  -     meclizine (ANTIVERT) 25 MG tablet; Take 1 tablet (25 mg) by mouth every 8 hours as needed for Dizziness.        ICD-10-CM ICD-9-CM    1. Screening for endocrine, metabolic and immunity disorder  Z13.29 V77.99 CBC w/ Diff Lavender    Z13.228  Comprehensive  Metabolic Panel Green    Z13.0  TSH, Blood - See Instructions      Lipid Panel Green Plasma Separator Tube   2. Dizziness  R42 780.4 CBC w/ Diff Lavender      HCG Quantitative, Blood Green Plasma Separator Tube      meclizine (ANTIVERT) 25 MG tablet             Doristine Locks, NP  [facility]  [email or web address]

## 2019-07-29 NOTE — Assessment & Plan Note (Signed)
Stable  Labs ordered   Follow up prn results

## 2019-07-30 ENCOUNTER — Telehealth (INDEPENDENT_AMBULATORY_CARE_PROVIDER_SITE_OTHER): Payer: Self-pay | Admitting: Nurse Practitioner

## 2019-07-30 NOTE — Telephone Encounter (Signed)
Okay to write letter for 12/10 and 12/11.   RTW 08/02/2019

## 2019-07-30 NOTE — Telephone Encounter (Signed)
Pt states, she woke up this am and felt worse today that yesterday.  So she could not go to work.  But she does want to return on Monday.  So, she needs a new letter not only for today, but to include yesterday.  Pt did get signed up for mychart, so we can send it through there.  If you approve.

## 2019-07-31 LAB — CBC WITH DIFF, BLOOD
Abs Basophils: 77 cells/uL (ref 0–200)
Abs Eosinophils: 353 cells/uL (ref 15–500)
Abs Lymphs: 2253 cells/uL (ref 850–3900)
Abs Monocytes: 533 cells/uL (ref 200–950)
Abs NRBC: 0 cells/uL
Abs Neutrophils: 5384 cells/uL (ref 1500–7800)
Basophils: 0.9 %
Eosinophils: 4.1 %
HCT: 40.4 % (ref 35.0–45.0)
HGB: 14.2 g/dL (ref 11.7–15.5)
Lymps: 26.2 %
MCH: 32.6 pg (ref 27.0–33.0)
MCHC: 35.1 g/dL (ref 32.0–36.0)
MCV: 92.9 fL (ref 80.0–100.0)
MPV: 10.8 fL (ref 7.5–12.5)
Monocytes: 6.2 %
PLT: 274 10*3/uL (ref 140–400)
RBC: 4.35 10*6/uL (ref 3.80–5.10)
RDW: 11.2 % (ref 11.0–15.0)
SEGS: 62.6 %
WBC: 8.6 10*3/uL (ref 3.8–10.8)

## 2019-07-31 LAB — LIPID(CHOL FRACT) PANEL, BLOOD
Chol/HDLC Ratio: 4 (calc) (ref <5.0)
Cholesterol: 163 mg/dL (ref <200)
HDL Cholesterol: 41 mg/dL — ABNORMAL LOW (ref >=50)
LDL-Cholesterol: 100 mg/dL (calc) — ABNORMAL HIGH
Non-HDL Cholesterol: 122 mg/dL (calc) (ref <130)
Triglycerides: 119 mg/dL (ref <150)

## 2019-07-31 LAB — COMPREHENSIVE METABOLIC PANEL, BLOOD
ALT (SGPT): 51 U/L — ABNORMAL HIGH (ref 6–29)
AST (SGOT): 32 U/L — ABNORMAL HIGH (ref 10–30)
Albumin/Glob Ratio: 1.6 (calc) (ref 1.0–2.5)
Albumin: 4.5 g/dL (ref 3.6–5.1)
Alkaline Phos: 60 U/L (ref 31–125)
BUN: 10 mg/dL (ref 7–25)
Bilirubin, Total: 0.8 mg/dL (ref 0.2–1.2)
Calcium: 9.5 mg/dL (ref 8.6–10.2)
Carbon Dioxide: 24 mmol/L (ref 20–32)
Chloride: 104 mmol/L (ref 98–110)
Creatinine: 0.69 mg/dL (ref 0.50–1.10)
Globulin: 2.9 g/dL (calc) (ref 1.9–3.7)
Glucose: 73 mg/dL (ref 65–99)
Potassium: 3.9 mmol/L (ref 3.5–5.3)
Sodium: 136 mmol/L (ref 135–146)
Total Protein: 7.4 g/dL (ref 6.1–8.1)
eGFR African American: 130 mL/min/{1.73_m2} (ref >=60)
eGFR non-Afr.American: 112 mL/min/{1.73_m2} (ref >=60)

## 2019-07-31 LAB — TSH, BLOOD: TSH: 0.71 mIU/L

## 2019-07-31 LAB — HCG QUANTITATIVE, BLOOD: HCG, Total Quant: <3 m[IU]/mL

## 2019-08-02 ENCOUNTER — Telehealth (INDEPENDENT_AMBULATORY_CARE_PROVIDER_SITE_OTHER): Payer: Self-pay | Admitting: Nurse Practitioner

## 2019-08-02 NOTE — Telephone Encounter (Addendum)
Pt was in form of lab results     ----- Message from Doristine Locks, NP sent at 08/01/2019  6:32 PM PST -----  CBC results wnl no anemia or infection.  CMP results wnl, kidney function wnl, electrolytes wnl, liver enzymes slightly elevated. Advised to improve high fiber diet, increased fluids, get daily exercise.  TSH results wnl  HCG negative.

## 2019-08-02 NOTE — Telephone Encounter (Signed)
I will send through mychart.

## 2019-08-05 ENCOUNTER — Encounter (INDEPENDENT_AMBULATORY_CARE_PROVIDER_SITE_OTHER): Admitting: Nurse Practitioner

## 2019-08-16 ENCOUNTER — Telehealth (INDEPENDENT_AMBULATORY_CARE_PROVIDER_SITE_OTHER): Admitting: General Practice

## 2019-08-16 ENCOUNTER — Encounter (INDEPENDENT_AMBULATORY_CARE_PROVIDER_SITE_OTHER): Payer: Self-pay | Admitting: General Practice

## 2019-08-16 VITALS — Ht 63.78 in | Wt 160.0 lb

## 2019-08-16 NOTE — Progress Notes (Signed)
Encounter Date:  08/16/19  12:13 PM   PCP: No primary care provider on file.   MRN: [id number]  DOB: 06/05/1981    [facility] VIDEO CALL SERVICES ENCOUNTER  Pandemic Response Ambulatory Protocol    Evaluator(s):   [name] is a 38 year old female who was evaluated by: Physician Assistant (via telemedicine)    Statement:    A Relevant History (including allergies, medications, past medical history, relevant review of systems) and relevant exam as performed by the named provider, are as transcribed in this and/or the accompanying note. Please also see the patient questionnaire for details.    Patient Verification & Video medicine Consent:      -I have verified that the patient's identification to be correct via verbal confirmation of birth date and address & valid: Yes    -The patient, and / or surrogate, has been made aware that patient is to be evaluated today using a home video medicine visit technique (audio, video & digital data transmission): Yes    - The patient, and / or surrogate, has been made aware that patient has the right to refuse this type of evaluation at any time during the assessment period, and has been made aware of any alternatives to this type of evaluation: Yes    -The patient, and / or surrogate, has been made aware that patient may need further evaluations in the future: Yes    -The patient, and / or surrogate, has signed a valid Informed Consent document (detailing risks, benefits, alternatives & costs), or is exempt from these requirements by law, which I verify is currently present in the [place] MEDICAL RECORD NUMBERYes    Patient is unable to be seen by a specialist in this clinic because:      Pandemic response ambulatory protocol.  Due to COVID-19 pandemic and a federally declared state of public health emergency, this service is being conducted via video call. Patient is at risk.     HPI:  Hannah Lloyd is a 38 year old female presents at Home for the following:      Chief Complaint   Patient presents with   . Sinus Problem     CONGESTION, SINUS PRESSURE/PAIN, SLIGHT COUGH, AND WEAKNESS.     Patient presents post exposure to COVID-19  Notes that she tested negative for COVID-19 on 08/11/2019 when she was asymptomatic  C/o congestion, sinus pressure, sinus pain, cough and weakness  Denies fever, chills, myalgia, rhinorrhea, sore throat, headache, wheezing, SOB, difficulty breathing, chest pain, nausea, vomiting, abdominal pain, diarrhea, or loss of taste and smell      PROBLEM  LIST:  Patient Active Problem List   Diagnosis   . Screening for endocrine, metabolic and immunity disorder   . Dizziness         PAST MEDICAL HISTORY:  Past Medical History:   Diagnosis Date   . Major depressive disorder, single episode    . Migraine        PAST SURGICAL HISTORY:  No past surgical history on file.     FAMILY HISTORY:   Family History   Problem Relation Name Age of Onset   . Hypertension Mother     . Other Mother     . Heart Attack Mother     . Ovarian Cancer Mother     . Breast Cancer Mother     . Hypertension Father     . Cholesterol/Lipid Disorder Father     . Ovarian  Cancer Sister     . Breast Cancer Sister           SOCIAL HISTORY:  Social History     Socioeconomic History   . Marital status: Married     Spouse name: Not on file   . Number of children: Not on file   . Years of education: Not on file   . Highest education level: Not on file   Occupational History   . Not on file   Social Needs   . Financial resource strain: Not on file   . Food insecurity     Worry: Not on file     Inability: Not on file   . Transportation needs     Medical: Not on file     Non-medical: Not on file   Tobacco Use   . Smoking status: Never Smoker   . Smokeless tobacco: Never Used   Substance and Sexual Activity   . Alcohol use: Yes     Frequency: Monthly or less   . Drug use: Never   . Sexual activity: Not on file   Lifestyle   . Physical activity     Days per week: Not on file     Minutes per session:  Not on file   . Stress: Not on file   Relationships   . Social Wellsite geologist on phone: Not on file     Gets together: Not on file     Attends religious service: Not on file     Active member of club or organization: Not on file     Attends meetings of clubs or organizations: Not on file     Relationship status: Not on file   . Intimate partner violence     Fear of current or ex partner: Not on file     Emotionally abused: Not on file     Physically abused: Not on file     Forced sexual activity: Not on file   Other Topics Concern   . Not on file   Social History Narrative   . Not on file     Social History     Tobacco Use   Smoking Status Never Smoker   Smokeless Tobacco Never Used      Social History     Substance and Sexual Activity   Alcohol Use Yes   . Frequency: Monthly or less      Social History     Substance and Sexual Activity   Drug Use Never          There is no immunization history on file for this patient.    Health Maintenance   Health Maintenance   Topic Date Due   . Cervical Cancer Screening  August 15, 1981   . PHQ2 depression screen  05/20/2001   . Tetanus (1 - Tdap) 05/20/2002   . Influenza (1) 03/20/2019   . Polio Vaccine  Aged Out   . HPV Vaccine <= 26 Yrs  Aged Out   . Meningococcal MCV4 Vaccine  Aged Out   . Pneumococcal 0-64 yrs  Aged Out        CURRENT  MEDICATIONS:  Current Outpatient Medications on File Prior to Visit   Medication Sig Dispense Refill   . meclizine (ANTIVERT) 25 MG tablet Take 1 tablet (25 mg) by mouth every 8 hours as needed for Dizziness. 30 tablet 0     No current facility-administered medications on file prior to  visit.      No outpatient medications have been marked as taking for the 08/16/19 encounter [facility]) with Satira Anis, [place].        ALLERGIES:    No Known Allergies       REVIEW OF SYSTEMS:  Review of Systems   Constitutional: Positive for fatigue. Negative for chills and fever.   HENT: Positive for congestion, sinus pressure and sinus  pain.    Respiratory: Positive for cough. Negative for shortness of breath.    Gastrointestinal: Negative for abdominal pain, diarrhea, nausea and vomiting.   Musculoskeletal: Negative for myalgias.   Neurological: Negative for headaches.   All other systems reviewed and are negative.        PHYSICAL EXAM:    There were no vitals filed for this visit.  Body mass index is 27.65 kg/m.    Ht Readings from Last 1 Encounters:   08/16/19 5' 3.78" (1.62 m)     Wt Readings from Last 1 Encounters:   08/16/19 72.6 kg (160 lb)         General Impression: Looks healthy, alert, no distress, pleasant affect, cooperative, communicative. Speech within normal limits. No signs of neurological decifits seen.  Patient is able to communicate clearly with no audible shortness of breath or wheezing.      ASSESSMENT & PLAN:    Mandeep was seen today for sinus problem.    Diagnoses and all orders for this visit:    Exposure to COVID-19 virus    Viral illness      Stable   Possible COVID-19 risk verse other viral URI   Denies severe SOB   Advised isolation 14 days from known exposure, or 7 days from onset of sx including 3 days wo a fever   Vitamin C 500 mg BID, Zinc 75-100 mg/day, and Vitamin D3 2000 - 4000 u/day    Start Flonase twice a day and Claritin daily    Over the counter Mucinex DM for cough    Increase water intake and rest     Over the counter Tylenol or ibuprofen for fever or body aches   Advised ER for any severe SOB or fever not responding to OTC medication    Follow up with primary care provider or call clinic in 2-4 days for check up        ICD-10-CM ICD-9-CM    1. Exposure to COVID-19 virus  Z20.828 V01.79    2. Viral illness  B34.9 079.99        Patient instructions:  See EPIC instructions. Reviewed verbally and/or AVS available via MYchart for patient.      Barriers to learning assessed: None.    Patient/family verbalizes understanding and is agreeable to above plan.    Patient was evaluated by Satira Anis at  [facility]    TIME SPENT ON MEDICAL DISCUSSION: 15 minutes  Start time: 12:15 PM  Stop time: 12:30 PM    By signing below, I acknowledge that I have reviewed the above note, dictated by me and scribed by Clarisa Fling, for accuracy and edited where necessary. The note accurately reflects the services provided at this encounter. We retain the right to modify this information in the event of errors. Occasional errors in punctuation, grammar and content may occur.      Electronically reviewed and signed by Satira Anis PA-C      [facility]  [email or web address]

## 2019-08-17 ENCOUNTER — Other Ambulatory Visit (INDEPENDENT_AMBULATORY_CARE_PROVIDER_SITE_OTHER): Admitting: Physician Assistant

## 2019-08-17 ENCOUNTER — Other Ambulatory Visit
Admission: RE | Admit: 2019-08-17 | Discharge: 2019-08-17 | Disposition: A | Discharge status: Home / Self Care | Payer: PPO | Attending: Physician Assistant | Admitting: Physician Assistant

## 2019-08-17 DIAGNOSIS — Z20828 Contact with and (suspected) exposure to other viral communicable diseases: Secondary | ICD-10-CM | POA: Insufficient documentation

## 2019-08-17 DIAGNOSIS — U071 COVID-19: Secondary | ICD-10-CM | POA: Insufficient documentation

## 2019-08-17 NOTE — Progress Notes (Signed)
Encounter Date:  08/17/19  2:28 PM   PCP: No primary care provider on file.  MRN: [id number]        HPI:  Hannah Lloyd is a 38 year old female presenting for COVID-19 testing.     Patient presents for drive through [id number] testing. Patient previously evaluated by provider who recommended testing.       PROBLEM  LIST:  Patient Active Problem List   Diagnosis   . Screening for endocrine, metabolic and immunity disorder   . Dizziness         PAST MEDICAL HISTORY:  Past Medical History:   Diagnosis Date   . Major depressive disorder, single episode    . Migraine        PAST SURGICAL HISTORY:  No past surgical history on file.     FAMILY HISTORY:   Family History   Problem Relation Name Age of Onset   . Hypertension Mother     . Other Mother     . Heart Attack Mother     . Ovarian Cancer Mother     . Breast Cancer Mother     . Hypertension Father     . Cholesterol/Lipid Disorder Father     . Ovarian Cancer Sister     . Breast Cancer Sister           SOCIAL HISTORY:  Social History     Socioeconomic History   . Marital status: Married     Spouse name: Not on file   . Number of children: Not on file   . Years of education: Not on file   . Highest education level: Not on file   Occupational History   . Not on file   Social Needs   . Financial resource strain: Not on file   . Food insecurity     Worry: Not on file     Inability: Not on file   . Transportation needs     Medical: Not on file     Non-medical: Not on file   Tobacco Use   . Smoking status: Never Smoker   . Smokeless tobacco: Never Used   Substance and Sexual Activity   . Alcohol use: Yes     Frequency: Monthly or less   . Drug use: Never   . Sexual activity: Not on file   Lifestyle   . Physical activity     Days per week: Not on file     Minutes per session: Not on file   . Stress: Not on file   Relationships   . Social Wellsite geologist on phone: Not on file     Gets together: Not on file     Attends religious service: Not on file     Active member of  club or organization: Not on file     Attends meetings of clubs or organizations: Not on file     Relationship status: Not on file   . Intimate partner violence     Fear of current or ex partner: Not on file     Emotionally abused: Not on file     Physically abused: Not on file     Forced sexual activity: Not on file   Other Topics Concern   . Not on file   Social History Narrative   . Not on file     Social History     Tobacco Use   Smoking Status Never Smoker  Smokeless Tobacco Never Used      Social History     Substance and Sexual Activity   Alcohol Use Yes   . Frequency: Monthly or less      Social History     Substance and Sexual Activity   Drug Use Never          There is no immunization history on file for this patient.      CURRENT  MEDICATIONS:  Current Outpatient Medications on File Prior to Visit   Medication Sig Dispense Refill   . meclizine (ANTIVERT) 25 MG tablet Take 1 tablet (25 mg) by mouth every 8 hours as needed for Dizziness. 30 tablet 0     No current facility-administered medications on file prior to visit.      No outpatient medications have been marked as taking for the 08/17/19 encounter (Technician Only ) with Saunders Glance, PA.        ALLERGIES:    No Known Allergies       REVIEW OF SYSTEMS:  Review of Systems  Unable to perform ROS: Acuity of condition    PHYSICAL EXAM:  There were no vitals filed for this visit.  There is no height or weight on file to calculate BMI.    Physical Exam  Nursing note reviewed.   Constitutional:    General: Not in acute distress.    Apperance: Not toxic-apperaing.  Neurological:    Mental Status: Alert.  Psychiatric:    Mood and Affect: Mood normal.       ASSESSMENT & PLAN:  Diagnoses and all orders for this visit:    Exposure to SARS-associated coronavirus  -     COVID-19 Coronavirus Detection Assay at [employer]; Future      Test completed.   Patient tolerated well.   Aftercare instructions given.    ICD-10-CM ICD-9-CM    1. Exposure to SARS-associated  coronavirus  Z20.828 V01.82 COVID-19 Coronavirus Detection Assay at [facility]         Future Appointments   Date Time Provider Department Center   08/19/2019 11:30 AM Cherrie Gauze, PA [facility]           Electronically reviewed and signed by Barbaraann Cao      [facility]  [email or web address]

## 2019-08-18 ENCOUNTER — Telehealth (INDEPENDENT_AMBULATORY_CARE_PROVIDER_SITE_OTHER): Payer: Self-pay | Admitting: General Practice

## 2019-08-18 LAB — COVID-19 CORONAVIRUS DETECTION ASSAY AT ~~LOC~~ LAB: COVID-19 Coronavirus Result: DETECTED — AB

## 2019-08-18 NOTE — Telephone Encounter (Signed)
-----   Message from [place], [place] sent at 08/18/2019  2:19 PM PST -----  COVID19 Result message: Saunders Glance ---> CC [facility] COVID-19 RESULTS POOL     Please notify patient of POSITIVE COVID19 result and review the following with patient    1. Review recommended management:  - Rest  - Stay well hydrated: Drink plenty of fluids, especially water and fluids with electrolytes.  - Tylenol as needed to reduce fever or pain/discomfort    2. Review home isolation instructions:  - Isolate at home: Do not leave your house except for medical care.   - If you live with others, isolate to one room, use a separate bathroom, avoid sharing household items, clean high touch surfaces daily.  - More information regarding home isolation can be found online at [email or web address] or your counties health dept website.     3. Review the 3 criteria for discharge from home isolation:  1. At least 10 days have passed since your symptoms first appeared    AND  2. You have had no fever for at least 24 hours (that is one full day of no fever without the use medicine that reduces fevers)  AND  3. Other symptoms have improved (for example, when your cough or shortness of breath have improved)    4. Review ER precautions with patient:  - If increasing SOB, or new chest pain develops call our office right away, or for severe symptoms, go to the ER. Please call the ER ahead of arrival to inform them you are COVID19 positive so they can be prepared.   - Also call our office if any other symptom becomes severe.    5. Offer patient rapid follow-up appointment if they would like to discuss further their COVID19 diagnosis with a provider.      Please fax result and completed CRM form to [phone].    Thank you,  Saunders Glance, PA-C

## 2019-08-18 NOTE — Telephone Encounter (Signed)
PT INFORMED OF MESSAGE BELOW PLEASE SUBMIT CRM FORM

## 2019-08-18 NOTE — Progress Notes (Signed)
COVID19 Result message: Hannah Lloyd ---> CC [facility] COVID-19 RESULTS POOL     Please notify patient of POSITIVE COVID19 result and review the following with patient    1. Review recommended management:  - Rest  - Stay well hydrated: Drink plenty of fluids, especially water and fluids with electrolytes.  - Tylenol as needed to reduce fever or pain/discomfort    2. Review home isolation instructions:  - Isolate at home: Do not leave your house except for medical care.   - If you live with others, isolate to one room, use a separate bathroom, avoid sharing household items, clean high touch surfaces daily.  - More information regarding home isolation can be found online at CDC.com or your counties health dept website.     3. Review the 3 criteria for discharge from home isolation:  1. At least 10 days have passed since your symptoms first appeared    AND  2. You have had no fever for at least 24 hours (that is one full day of no fever without the use medicine that reduces fevers)  AND  3. Other symptoms have improved (for example, when your cough or shortness of breath have improved)    4. Review ER precautions with patient:  - If increasing SOB, or new chest pain develops call our office right away, or for severe symptoms, go to the ER. Please call the ER ahead of arrival to inform them you are COVID19 positive so they can be prepared.   - Also call our office if any other symptom becomes severe.    5. Offer patient rapid follow-up appointment if they would like to discuss further their COVID19 diagnosis with a provider.      Please fax result and completed CRM form to [phone].    Thank you,  Zhania Shaheen, PA-C

## 2019-08-19 ENCOUNTER — Encounter (INDEPENDENT_AMBULATORY_CARE_PROVIDER_SITE_OTHER): Admitting: Physician Assistant

## 2019-08-19 NOTE — Telephone Encounter (Signed)
CRM FORM FILLED OUT

## 2019-08-20 ENCOUNTER — Encounter (INDEPENDENT_AMBULATORY_CARE_PROVIDER_SITE_OTHER): Payer: Self-pay

## 2019-08-31 ENCOUNTER — Telehealth (INDEPENDENT_AMBULATORY_CARE_PROVIDER_SITE_OTHER): Admitting: Nurse Practitioner

## 2019-08-31 ENCOUNTER — Encounter (INDEPENDENT_AMBULATORY_CARE_PROVIDER_SITE_OTHER): Payer: Self-pay | Admitting: Nurse Practitioner

## 2019-08-31 MED ORDER — METHYLPREDNISOLONE 4 MG OR KIT
ORAL_TABLET | ORAL | 0 refills | Status: DC
Start: 2019-08-31 — End: 2019-09-14

## 2019-08-31 MED ORDER — AZITHROMYCIN 250 MG OR TABS
250.0000 mg | ORAL_TABLET | ORAL | 0 refills | Status: DC
Start: 2019-08-31 — End: 2019-09-14

## 2019-08-31 NOTE — Progress Notes (Signed)
Encounter Date:  08/31/19  9:39 AM   PCP: No primary care provider on file.   MRN: [id number]  DOB: [birth year]    [facility] VIDEO CALL SERVICES ENCOUNTER  Pandemic Response Ambulatory Protocol    Evaluator(s):   Talya Livermore is a 39 year old female  who was evaluated by: Nurse Practitioner (via telemedicine)    Statement:    A Relevant History (including allergies, medications, past medical history, relevant review of systems) and relevant exam as performed by the named provider, are as transcribed in this and/or the accompanying note. Please also see the patient questionnaire for details.    Patient Verification & Video medicine Consent:      -I have verified that the patient's identification to be correct via verbal confirmation of birth date and address & valid: Yes    -The patient, and / or surrogate, has been made aware that patient is to be evaluated today using a home video medicine visit technique (audio, video & digital data transmission): Yes    - The patient, and / or surrogate, has been made aware that patient has the right to refuse this type of evaluation at any time during the assessment period, and has been made aware of any alternatives to this type of evaluation: Yes    -The patient, and / or surrogate,  has been made aware that patient may need further evaluations in the future: Yes    -The patient, and / or surrogate, has signed a valid Informed Consent document (detailing risks, benefits, alternatives & costs), or is exempt from these requirements by law, which I verify is currently present in the [place] MEDICAL RECORD NUMBERYes    Patient is unable to be seen by a specialist in this clinic because:      Pandemic response ambulatory protocol.  Due to COVID-19 pandemic and a federally declared state of public health emergency, this service is being conducted via video call. Patient is at risk.     HPI:  Helyn Ozolins is a 39 year old female presents at Home for the following:      Chief Complaint   Patient presents with   . Other     39 yo F pt wanting to discuss sx's that have been ongoing after testing positive for covid on 12/29. pt states headaches, stuffy nose, and sore throat. pt has used emergency as well as Vit C and D. pt also states L ankle pain came out of no where no injury or broken bones has taken Tylenol and wraps with some relief      Video appointment  Reported tested COVID 19 positive 08/17/2019  Continues to feel H/A, nasal congestion, ST, cough and occ SOB  Continues quarantine  Continues to take vit c and vit D3    C/o left ankle pain   Onset 08/25/2019  Denied fall or injury   Denied swelling   Treated with tylenol and compression     PROBLEM  LIST:  Patient Active Problem List   Diagnosis   . Screening for endocrine, metabolic and immunity disorder   . Dizziness   . COVID-19   . Arthralgia of left ankle         PAST MEDICAL HISTORY:  Past Medical History:   Diagnosis Date   . Major depressive disorder, single episode    . Migraine        PAST SURGICAL HISTORY:  No past surgical history on file.  FAMILY HISTORY:   Family History   Problem Relation Name Age of Onset   . Hypertension Mother     . Other Mother     . Heart Attack Mother     . Ovarian Cancer Mother     . Breast Cancer Mother     . Hypertension Father     . Cholesterol/Lipid Disorder Father     . Ovarian Cancer Sister     . Breast Cancer Sister           SOCIAL HISTORY:  Social History     Socioeconomic History   . Marital status: Married     Spouse name: Not on file   . Number of children: Not on file   . Years of education: Not on file   . Highest education level: Not on file   Occupational History   . Not on file   Social Needs   . Financial resource strain: Not on file   . Food insecurity     Worry: Not on file     Inability: Not on file   . Transportation needs     Medical: Not on file     Non-medical: Not on file   Tobacco Use   . Smoking status: Never Smoker   . Smokeless tobacco: Never Used    Substance and Sexual Activity   . Alcohol use: Yes     Frequency: Monthly or less   . Drug use: Never   . Sexual activity: Not on file   Lifestyle   . Physical activity     Days per week: Not on file     Minutes per session: Not on file   . Stress: Not on file   Relationships   . Social Wellsite geologist on phone: Not on file     Gets together: Not on file     Attends religious service: Not on file     Active member of club or organization: Not on file     Attends meetings of clubs or organizations: Not on file     Relationship status: Not on file   . Intimate partner violence     Fear of current or ex partner: Not on file     Emotionally abused: Not on file     Physically abused: Not on file     Forced sexual activity: Not on file   Other Topics Concern   . Not on file   Social History Narrative   . Not on file     Social History     Tobacco Use   Smoking Status Never Smoker   Smokeless Tobacco Never Used      Social History     Substance and Sexual Activity   Alcohol Use Yes   . Frequency: Monthly or less      Social History     Substance and Sexual Activity   Drug Use Never          There is no immunization history on file for this patient.    Health Maintenance   Health Maintenance   Topic Date Due   . Cervical Cancer Screening  [birth year] 1983   . PHQ2 depression screen  05/20/2001   . Tetanus (1 - Tdap) 05/20/2002   . Influenza (1) 03/20/2019   . Polio Vaccine  Aged Out   . HPV Vaccine <= 26 Yrs  Aged Out   . Meningococcal MCV4 Vaccine  Aged Out   .  Pneumococcal 0-64 yrs  Aged Out        CURRENT  MEDICATIONS:  Current Outpatient Medications on File Prior to Visit   Medication Sig Dispense Refill   . meclizine (ANTIVERT) 25 MG tablet Take 1 tablet (25 mg) by mouth every 8 hours as needed for Dizziness. 30 tablet 0     No current facility-administered medications on file prior to visit.      Outpatient Medications Marked as Taking for the 08/31/19 encounter [facility]) with Doristine Locks, NP    Medication Sig Dispense Refill   . meclizine (ANTIVERT) 25 MG tablet Take 1 tablet (25 mg) by mouth every 8 hours as needed for Dizziness. 30 tablet 0        ALLERGIES:    No Known Allergies       REVIEW OF SYSTEMS:  Review of Systems   All other systems reviewed and are negative.  refer to HPI       PHYSICAL EXAM:    There were no vitals filed for this visit.  There is no height or weight on file to calculate BMI.    Ht Readings from Last 1 Encounters:   08/16/19 5' 3.78" (1.62 m)     Wt Readings from Last 1 Encounters:   08/16/19 72.6 kg (160 lb)         General Impression: Looks healthy, alert, no distress, pleasant affect, cooperative, communicative. Speech within normal limits. No signs of neurological decifits seen.      ASSESSMENT & PLAN:    Naasia was seen today for other.    Diagnoses and all orders for this visit:    COVID-19  Assessment & Plan:  Active  Worsening s/s  Start Azithromycin 250 mg take ad  Start medrol 4 mg dose pack  Rest, hydrate, take tylenol or IBU prn   Cont vitamins  ER precautions given   Cont quarantine  Follow up prn     Orders:  -     azithromycin (ZITHROMAX) 250 MG tablet; Take 1 tablet (250 mg) by mouth As Directed. Take 2 tablets by mouth on day 1, then take 1 tablet daily on days 2-5.  -     methylPREDNISolone (MEDROL DOSEPACK) 4 MG tablet; Take as directed on package    Arthralgia of left ankle  Assessment & Plan:  Active  Rest, elevate, apply ice prn   Take tylenol prn pain   Cont to monitor  Follow up prn           ICD-10-CM ICD-9-CM    1. COVID-19  U07.1 079.89 azithromycin (ZITHROMAX) 250 MG tablet      methylPREDNISolone (MEDROL DOSEPACK) 4 MG tablet   2. Arthralgia of left ankle  M25.572 719.47        Patient instructions:  See EPIC instructions.  Reviewed verbally and/or AVS available via MYchart for patient.      Barriers to learning assessed: None.    Patient/family verbalizes understanding and is agreeable to above plan.    Patient was evaluated by Darien Ramus  at [facility]  [place]    TIME SPENT ON MEDICAL DISCUSSION: 10 min  Start time: 0939  Stop time: 0949    [facility]  [email or web address]   [phone]

## 2019-08-31 NOTE — Assessment & Plan Note (Signed)
Active  Worsening s/s  Start Azithromycin 250 mg take ad  Start medrol 4 mg dose pack  Rest, hydrate, take tylenol or IBU prn   Cont vitamins  ER precautions given   Cont quarantine  Follow up prn

## 2019-08-31 NOTE — Assessment & Plan Note (Signed)
Active  Rest, elevate, apply ice prn   Take tylenol prn pain   Cont to monitor  Follow up prn

## 2019-09-03 ENCOUNTER — Encounter: Payer: Self-pay | Admitting: Hospital

## 2019-09-14 ENCOUNTER — Encounter (INDEPENDENT_AMBULATORY_CARE_PROVIDER_SITE_OTHER): Payer: Self-pay | Admitting: Nurse Practitioner

## 2019-09-14 ENCOUNTER — Telehealth (INDEPENDENT_AMBULATORY_CARE_PROVIDER_SITE_OTHER): Payer: Self-pay | Admitting: Nurse Practitioner

## 2019-09-14 ENCOUNTER — Telehealth (INDEPENDENT_AMBULATORY_CARE_PROVIDER_SITE_OTHER): Admitting: Nurse Practitioner

## 2019-09-14 NOTE — Assessment & Plan Note (Signed)
Stable  Asymptomatic since 09/03/2019  Cleared to RTW 09/20/2019

## 2019-09-14 NOTE — Telephone Encounter (Signed)
My chart letter done.

## 2019-09-14 NOTE — Progress Notes (Signed)
Encounter Date:  09/14/19  9:30 AM   PCP: No primary care provider on file.   MRN: [id number]  DOB: 20-May-1981    [facility] TELEPHONE SERVICES ENCOUNTER  Pandemic Response Ambulatory Protocol    Evaluator(s):   Hannah Lloyd is a 39 year old female  who was evaluated by: Nurse Practitioner (via telemedicine)    Statement:    A Relevant History (including allergies, medications, past medical history, relevant review of systems) and relevant exam as performed by the named provider, are as transcribed in this and/or the accompanying note. Please also see the patient questionnaire for details.    Patient Verification & Telemedicine Consent:      -I have verified that the patient's identification to be correct via verbal confirmation of birth date and address & valid: Yes    -The patient, and / or surrogate, has been made aware that patient is to be evaluated today using a home telemedicine visit technique (audio transmission): Yes    - The patient, and / or surrogate, has been made aware that patient has the right to refuse this type of evaluation at any time during the assessment period, and has been made aware of any alternatives to this type of evaluation: Yes    -The patient, and / or surrogate,  has been made aware that patient may need further evaluations in the future: Yes    -The patient, and / or surrogate, has signed a valid Informed Consent document (detailing risks, benefits, alternatives & costs), or is exempt from these requirements by law, which I verify is currently present in the [place] MEDICAL RECORD NUMBERYes    Patient is unable to be seen by a specialist in this clinic because:      Pandemic response ambulatory protocol.  Due to COVID-19 pandemic and a federally declared state of public health emergency, this service is being conducted via telephone. Patient is at risk.     HPI:  Hannah Lloyd is a 38 year old female presents at Home for the following:   Chief Complaint   Patient  presents with   . Other     39 yo F pt states poss letter for clearance RTW after being positive for COVID on Dec 28. 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 Screening for endocrine, metabolic and immunity disorder   . Dizziness   . COVID-19   . Arthralgia of left ankle         PAST MEDICAL HISTORY:  Past Medical History:   Diagnosis Date   . Major depressive disorder, single episode    . Migraine          SOCIAL HISTORY:  Social History     Socioeconomic History   . Marital status: Married     Spouse name: Not on file   . Number of children: Not on file   . Years of education: Not on file   . Highest education level: Not on file   Occupational History   . Not on file   Social Needs   . Financial resource strain: Not on file   . Food insecurity  Worry: Not on file     Inability: Not on file   . Transportation needs     Medical: Not on file     Non-medical: Not on file   Tobacco Use   . Smoking status: Never Smoker   . Smokeless tobacco: Never Used   Substance and Sexual Activity   . Alcohol use: Yes     Frequency: Monthly or less   . Drug use: Never   . Sexual activity: Not on file   Lifestyle   . Physical activity     Days per week: Not on file     Minutes per session: Not on file   . Stress: Not on file   Relationships   . Social Wellsite geologist on phone: Not on file     Gets together: Not on file     Attends religious service: Not on file     Active member of club or organization: Not on file     Attends meetings of clubs or organizations: Not on file     Relationship status: Not on file   . Intimate partner violence     Fear of current or ex partner: Not on file     Emotionally abused: Not on file     Physically abused: Not on file     Forced  sexual activity: Not on file   Other Topics Concern   . Not on file   Social History Narrative   . Not on file     Social History     Tobacco Use   Smoking Status Never Smoker   Smokeless Tobacco Never Used      Social History     Substance and Sexual Activity   Alcohol Use Yes   . Frequency: Monthly or less      Social History     Substance and Sexual Activity   Drug Use Never          There is no immunization history on file for this patient.      CURRENT  MEDICATIONS:  Current Outpatient Medications on File Prior to Visit   Medication Sig Dispense Refill   . [DISCONTINUED] azithromycin (ZITHROMAX) 250 MG tablet Take 1 tablet (250 mg) by mouth As Directed. Take 2 tablets by mouth on day 1, then take 1 tablet daily on days 2-5. 6 tablet 0   . meclizine (ANTIVERT) 25 MG tablet Take 1 tablet (25 mg) by mouth every 8 hours as needed for Dizziness. 30 tablet 0   . [DISCONTINUED] methylPREDNISolone (MEDROL DOSEPACK) 4 MG tablet Take as directed on package 1 Package 0     No current facility-administered medications on file prior to visit.      Outpatient Medications Marked as Taking for the 09/14/19 encounter (Non-Face-to-Face) with Doristine Locks, NP   Medication Sig Dispense Refill   . meclizine (ANTIVERT) 25 MG tablet Take 1 tablet (25 mg) by mouth every 8 hours as needed for Dizziness. 30 tablet 0        ALLERGIES:    No Known Allergies       REVIEW OF SYSTEMS:  Review of Systems   All other systems reviewed and are negative.    Refer to HPI     PHYSICAL EXAM:     09/14/19  0858   Temp: 97.7 F (36.5 C)     Body mass index is 27.65 kg/m.    Ht Readings from Last 1 Encounters:  09/14/19 5' 3.78" (1.62 m)     Wt Readings from Last 1 Encounters:   09/14/19 72.6 kg (160 lb)       General Impression: No physical exam done. Sounds healthy, alert, no distress, pleasant affect, cooperative, communicative. Speech within normal limits.     ASSESSMENT & PLAN:    Hannah Lloyd was seen today for other.    Diagnoses and all orders  for this visit:    COVID-19  Assessment & Plan:  Stable  Asymptomatic since 09/03/2019  Cleared to RTW 09/20/2019          ICD-10-CM ICD-9-CM    1. COVID-19  U07.1 079.89        Patient instructions:  See EPIC instructions.  Reviewed verbally and/or AVS available via MYchart for patient.      Barriers to learning assessed: None.    Patient/family verbalizes understanding and is agreeable to above plan.    Patient was evaluated by Darien Ramus at [facility] [place]      TIME SPENT ON MEDICAL DISCUSSION INCLUDING BEFORE, DURING, AND AFTER PHONE CONVERSATION: 10 min   Start time: 0930  Stop time: 0940    [facility]  [email or web address]    [phone]

## 2019-11-17 ENCOUNTER — Encounter (INDEPENDENT_AMBULATORY_CARE_PROVIDER_SITE_OTHER): Payer: Self-pay

## 2019-12-15 ENCOUNTER — Ambulatory Visit (INDEPENDENT_AMBULATORY_CARE_PROVIDER_SITE_OTHER): Admitting: Internal Medicine

## 2019-12-15 ENCOUNTER — Encounter (INDEPENDENT_AMBULATORY_CARE_PROVIDER_SITE_OTHER): Payer: Self-pay | Admitting: Internal Medicine

## 2019-12-15 VITALS — BP 104/80 | HR 88 | Temp 97.8°F | Resp 16 | Ht 64.37 in | Wt 164.4 lb

## 2019-12-15 MED ORDER — ESCITALOPRAM OXALATE 5 MG OR TABS
5.0000 mg | ORAL_TABLET | Freq: Every day | ORAL | 0 refills | Status: DC
Start: 2019-12-15 — End: 2019-12-29
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
## 2019-12-15 NOTE — Assessment & Plan Note (Signed)
Done today.

## 2019-12-18 LAB — THINPREP TIS AND HPV MRNA E6/E7 - QUEST
HPV mRNA E6/E7: NOT DETECTED
Interp: NEGATIVE

## 2019-12-18 LAB — SURESWAB, VAGINOSIS/VAGINITIS PLUS - QUEST
Atopobium vaginae: NOT DETECTED Log (cells/mL)
C. Albicans, DNA: DETECTED — AB
C. glabrate, DNA: NOT DETECTED
C. parapsilosis, DNA: NOT DETECTED
C. tropicalis, DNA: NOT DETECTED
Chlamydia Trachomatis rRNA: NOT DETECTED
Gardnerella vaginalis: <4.7 Log (cells/mL)
Lactobacillus Species: 6.9 Log (cells/mL)
Megasphaera Species: NOT DETECTED Log (cells/mL)
Neisseria Gonorrhoeae rRNA: NOT DETECTED
Trichomonas vaginalis RNA: NOT DETECTED

## 2019-12-21 MED ORDER — FLUCONAZOLE 150 MG OR TABS
150.0000 mg | ORAL_TABLET | Freq: Once | ORAL | 0 refills | Status: AC
Start: 2019-12-21 — End: 2019-12-21

## 2019-12-21 NOTE — Addendum Note (Signed)
 Addended by: Amayiah Gosnell on: 12/21/2019 08:17 AM     Modules accepted: Orders

## 2019-12-23 ENCOUNTER — Telehealth (INDEPENDENT_AMBULATORY_CARE_PROVIDER_SITE_OTHER): Payer: Self-pay | Admitting: Internal Medicine

## 2019-12-23 ENCOUNTER — Ambulatory Visit (INDEPENDENT_AMBULATORY_CARE_PROVIDER_SITE_OTHER): Admitting: Internal Medicine

## 2019-12-23 NOTE — Telephone Encounter (Signed)
 Pt came in today for an appointment on 12/23/19 with complaints of numbness on right side of body, trouble walking and unable to raise right arm, and being dizzy,  following a syncope episode at her place of work, Pt was asked to remove her mask to see if any drooping had occurred a little drooping noted, Pt was then asked the date to which she hesitated until she looked at her smart watch. I did not take any vitals, instead went and got Dr. Norton Beech to triage whom suggested Pt to go to ER and be taken by ambulance Pt declined stating she will be driven there. Pt was assisted to her car in wheelchair.

## 2019-12-24 NOTE — Progress Notes (Signed)
 Patient was evaluated and recommended ED visit immediately. No charge for visit as VS were not taken.

## 2019-12-24 NOTE — Assessment & Plan Note (Signed)
 ED visit evaluation recommended

## 2019-12-27 ENCOUNTER — Telehealth (INDEPENDENT_AMBULATORY_CARE_PROVIDER_SITE_OTHER): Payer: Self-pay | Admitting: Internal Medicine

## 2019-12-27 ENCOUNTER — Encounter (INDEPENDENT_AMBULATORY_CARE_PROVIDER_SITE_OTHER): Payer: Self-pay

## 2019-12-27 NOTE — Telephone Encounter (Addendum)
 Pt informed of message below, pt expressed verbal understanding. Pt asking if medication that was sent could be the cause of her current sx pt came in office with complaints of numbness on right side of body, trouble walking and unable to raise right arm, and being dizzy,  following a syncope episode at her place of work, Pt was asked to remove her mask to see if any drooping had occurred a little drooping noted on 5/6      ----- Message from Darice Edelman, MD sent at 12/21/2019  7:53 AM PDT -----  Please call the patient regarding PAP smear/discharge  PAP smear is normal, and negative for HPV. Repeat in 5 years  She does have candida. Medication sent.

## 2019-12-29 ENCOUNTER — Encounter (INDEPENDENT_AMBULATORY_CARE_PROVIDER_SITE_OTHER): Payer: Self-pay

## 2019-12-29 ENCOUNTER — Ambulatory Visit (INDEPENDENT_AMBULATORY_CARE_PROVIDER_SITE_OTHER): Admitting: Internal Medicine

## 2019-12-29 ENCOUNTER — Encounter (INDEPENDENT_AMBULATORY_CARE_PROVIDER_SITE_OTHER): Payer: Self-pay | Admitting: Internal Medicine

## 2019-12-29 VITALS — BP 104/80 | HR 78 | Temp 97.7°F | Resp 16 | Ht 64.37 in | Wt 161.8 lb

## 2019-12-29 MED ORDER — SUMATRIPTAN SUCCINATE 100 MG OR TABS
100.0000 mg | ORAL_TABLET | Freq: Once | ORAL | 0 refills | Status: DC | PRN
Start: 2019-12-29 — End: 2020-05-11

## 2019-12-29 MED ORDER — AMITRIPTYLINE HCL 50 MG OR TABS
50.0000 mg | ORAL_TABLET | Freq: Every evening | ORAL | 0 refills | Status: DC
Start: 2019-12-29 — End: 2020-01-13
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
## 2019-12-29 NOTE — Assessment & Plan Note (Signed)
 Will start prophylactic treatment with amitriptyline 

## 2020-01-12 ENCOUNTER — Ambulatory Visit (INDEPENDENT_AMBULATORY_CARE_PROVIDER_SITE_OTHER): Admitting: Internal Medicine

## 2020-01-13 ENCOUNTER — Ambulatory Visit (INDEPENDENT_AMBULATORY_CARE_PROVIDER_SITE_OTHER): Admitting: Internal Medicine

## 2020-01-13 VITALS — BP 132/70 | HR 66 | Temp 97.9°F | Resp 16 | Ht 64.37 in | Wt 166.2 lb

## 2020-01-13 MED ORDER — ERENUMAB-AOOE 70 MG/ML SC SOAJ
70.0000 mg | SUBCUTANEOUS | 0 refills | Status: AC
Start: 2020-01-13 — End: 2020-02-12

## 2020-01-19 ENCOUNTER — Encounter (INDEPENDENT_AMBULATORY_CARE_PROVIDER_SITE_OTHER): Payer: Self-pay | Admitting: Internal Medicine

## 2020-01-19 NOTE — Telephone Encounter (Signed)
 Note and order have been faxed to Dr. Angelina Kempf at [phone] on 01/19/20

## 2020-01-19 NOTE — Telephone Encounter (Signed)
 Please send my note and referral to phone provided

## 2020-01-24 ENCOUNTER — Telehealth (INDEPENDENT_AMBULATORY_CARE_PROVIDER_SITE_OTHER): Payer: Self-pay | Admitting: Internal Medicine

## 2020-01-24 NOTE — Telephone Encounter (Signed)
 Pt requesting a referral to Neurologist, and PT.  Pt changed her insurance to; [email or web address] if Pt needs a new order due to new Insurance.   I have faxed over the orders to PT and Neurologist as requested incase they can use the same order already on file.

## 2020-01-28 ENCOUNTER — Encounter (INDEPENDENT_AMBULATORY_CARE_PROVIDER_SITE_OTHER): Payer: Self-pay | Admitting: Internal Medicine

## 2020-04-28 ENCOUNTER — Ambulatory Visit (INDEPENDENT_AMBULATORY_CARE_PROVIDER_SITE_OTHER): Admitting: Family Medicine

## 2020-04-28 VITALS — BP 118/60 | HR 70 | Temp 97.6°F | Resp 16 | Ht 64.37 in

## 2020-04-28 NOTE — Assessment & Plan Note (Addendum)
 Stable   Per pt stated follows up with Dr. Mar Semen neurologist  Discussed ER visit with pt   Advised to establish with cardiologist   Takes Imitrex  100 mg tab PRN  ER / UC precautions discussed   Monitoring

## 2020-04-28 NOTE — Assessment & Plan Note (Signed)
 Progressing  Discussed ER visit  Takes Imitrex  100mg  tab PRN  Per pt follows up with neurology   Monitoring for changes

## 2020-05-02 ENCOUNTER — Encounter (INDEPENDENT_AMBULATORY_CARE_PROVIDER_SITE_OTHER): Payer: Self-pay

## 2020-05-11 ENCOUNTER — Encounter (INDEPENDENT_AMBULATORY_CARE_PROVIDER_SITE_OTHER): Payer: Self-pay | Admitting: Nurse Practitioner

## 2020-05-11 ENCOUNTER — Telehealth (INDEPENDENT_AMBULATORY_CARE_PROVIDER_SITE_OTHER): Admitting: Nurse Practitioner

## 2020-05-11 MED ORDER — METHYLPREDNISOLONE 4 MG OR KIT
ORAL_TABLET | ORAL | 0 refills | Status: DC
Start: 2020-05-11 — End: 2020-08-08

## 2020-05-20 ENCOUNTER — Encounter (INDEPENDENT_AMBULATORY_CARE_PROVIDER_SITE_OTHER): Payer: Self-pay

## 2020-08-08 ENCOUNTER — Encounter (INDEPENDENT_AMBULATORY_CARE_PROVIDER_SITE_OTHER): Payer: Self-pay | Admitting: Nurse Practitioner

## 2020-08-08 ENCOUNTER — Telehealth (INDEPENDENT_AMBULATORY_CARE_PROVIDER_SITE_OTHER): Admitting: Nurse Practitioner

## 2020-08-08 VITALS — Temp 98.9°F | Ht 64.37 in | Wt 165.0 lb
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
## 2020-08-08 NOTE — Assessment & Plan Note (Signed)
 Active   Pt tested negative for covid on   No medication at this time   Pt has had symptoms for two days   Letter for work given today,gave two days off  Advised to stay hydrated   Advised to take vitamin C and Zinc supplements   Advised to stay home and get adequate amounts of rest   Continue to monitor   Follow up if symptoms persist or worsen

## 2020-08-17 ENCOUNTER — Telehealth (INDEPENDENT_AMBULATORY_CARE_PROVIDER_SITE_OTHER): Admitting: Family Practice

## 2020-08-17 MED ORDER — AMOXICILLIN-POT CLAVULANATE 875-125 MG OR TABS
1.0000 | ORAL_TABLET | Freq: Two times a day (BID) | ORAL | 0 refills | Status: DC
Start: 2020-08-17 — End: 2020-09-22

## 2020-08-17 NOTE — Assessment & Plan Note (Addendum)
 Active   Start on Augmentin 875-125 mg BID x 7 days   Rest, increase fluid intake.   Avoid dairy which can make congestion worse  Doctor's note written giving pt time off work for 1 week   Recommend follow up within the next week it not better.
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
## 2020-08-18 ENCOUNTER — Telehealth (INDEPENDENT_AMBULATORY_CARE_PROVIDER_SITE_OTHER): Admitting: Internal Medicine

## 2020-08-22 ENCOUNTER — Telehealth (INDEPENDENT_AMBULATORY_CARE_PROVIDER_SITE_OTHER): Admitting: Nurse Practitioner

## 2020-09-22 ENCOUNTER — Ambulatory Visit (INDEPENDENT_AMBULATORY_CARE_PROVIDER_SITE_OTHER): Admitting: Nurse Practitioner

## 2020-09-22 MED ORDER — FLUOXETINE HCL 10 MG OR CAPS
10.0000 mg | ORAL_CAPSULE | Freq: Every day | ORAL | 0 refills | Status: DC
Start: 2020-09-22 — End: 2020-11-20
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
## 2020-09-22 NOTE — Patient Instructions (Signed)
Fluoxetine capsules or tablets (Depression/Mood Disorders)  What is this medicine?  FLUOXETINE (floo OX e teen) belongs to a class of drugs known as selective serotonin reuptake inhibitors (SSRIs). It helps to treat mood problems such as depression, obsessive compulsive disorder, and panic attacks. It can also treat certain eating disorders.  This medicine may be used for other purposes; ask your health care provider or pharmacist if you have questions.  COMMON BRAND NAME(S): Prozac  What should I tell my health care provider before I take this medicine?  They need to know if you have any of these conditions:  -bipolar disorder or a family history of bipolar disorder  -bleeding disorders  -glaucoma  -heart disease  -liver disease  -low levels of sodium in the blood  -seizures  -suicidal thoughts, plans, or attempt; a previous suicide attempt by you or a family member  -take MAOIs like Carbex, Eldepryl, Marplan, Nardil, and Parnate  -take medicines that treat or prevent blood clots  -thyroid disease  -an unusual or allergic reaction to fluoxetine, other medicines, foods, dyes, or preservatives  -pregnant or trying to get pregnant  -breast-feeding  How should I use this medicine?  Take this medicine by mouth with a glass of water. Follow the directions on the prescription label. You can take this medicine with or without food. Take your medicine at regular intervals. Do not take it more often than directed. Do not stop taking this medicine suddenly except upon the advice of your doctor. Stopping this medicine too quickly may cause serious side effects or your condition may worsen.  A special MedGuide will be given to you by the pharmacist with each prescription and refill. Be sure to read this information carefully each time.  Talk to your pediatrician regarding the use of this medicine in children. While this drug may be prescribed for children as young as 7 years for selected conditions, precautions do  apply.  Overdosage: If you think you have taken too much of this medicine contact a poison control center or emergency room at once.  NOTE: This medicine is only for you. Do not share this medicine with others.  What if I miss a dose?  If you miss a dose, skip the missed dose and go back to your regular dosing schedule. Do not take double or extra doses.  What may interact with this medicine?  Do not take this medicine with any of the following medications:  -other medicines containing fluoxetine, like Sarafem or Symbyax  -cisapride  -dronedarone  -linezolid  -MAOIs like Carbex, Eldepryl, Marplan, Nardil, and Parnate  -methylene blue (injected into a vein)  -pimozide  -thioridazine  This medicine may also interact with the following medications:  -alcohol  -amphetamines  -aspirin and aspirin-like medicines  -carbamazepine  -certain medicines for depression, anxiety, or psychotic disturbances  -certain medicines for migraine headaches like almotriptan, eletriptan, frovatriptan, naratriptan, rizatriptan, sumatriptan, zolmitriptan  -digoxin  -diuretics  -fentanyl  -flecainide  -furazolidone  -isoniazid  -lithium  -medicines for sleep  -medicines that treat or prevent blood clots like warfarin, enoxaparin, and dalteparin  -NSAIDs, medicines for pain and inflammation, like ibuprofen or naproxen  -other medicines that prolong the QT interval (an abnormal heart rhythm)  -phenytoin  -procarbazine  -propafenone  -rasagiline  -ritonavir  -supplements like St. John's wort, kava kava, valerian  -tramadol  -tryptophan  -vinblastine  This list may not describe all possible interactions. Give your health care provider a list of all the medicines, herbs, non-prescription   drugs, or dietary supplements you use. Also tell them if you smoke, drink alcohol, or use illegal drugs. Some items may interact with your medicine.  What should I watch for while using this medicine?  Tell your doctor if your symptoms do not get better or if they  get worse. Visit your doctor or health care professional for regular checks on your progress. Because it may take several weeks to see the full effects of this medicine, it is important to continue your treatment as prescribed by your doctor.  Patients and their families should watch out for new or worsening thoughts of suicide or depression. Also watch out for sudden changes in feelings such as feeling anxious, agitated, panicky, irritable, hostile, aggressive, impulsive, severely restless, overly excited and hyperactive, or not being able to sleep. If this happens, especially at the beginning of treatment or after a change in dose, call your health care professional.  You may get drowsy or dizzy. Do not drive, use machinery, or do anything that needs mental alertness until you know how this medicine affects you. Do not stand or sit up quickly, especially if you are an older patient. This reduces the risk of dizzy or fainting spells. Alcohol may interfere with the effect of this medicine. Avoid alcoholic drinks.  Your mouth may get dry. Chewing sugarless gum or sucking hard candy, and drinking plenty of water may help. Contact your doctor if the problem does not go away or is severe.  This medicine may affect blood sugar levels. If you have diabetes, check with your doctor or health care professional before you change your diet or the dose of your diabetic medicine.  What side effects may I notice from receiving this medicine?  Side effects that you should report to your doctor or health care professional as soon as possible:  -allergic reactions like skin rash, itching or hives, swelling of the face, lips, or tongue  -anxious  -black, tarry stools  -breathing problems  -changes in vision  -confusion  -elevated mood, decreased need for sleep, racing thoughts, impulsive behavior  -eye pain  -fast, irregular heartbeat  -feeling faint or lightheaded, falls  -feeling agitated, angry, or irritable  -hallucination, loss  of contact with reality  -loss of balance or coordination  -loss of memory  -painful or prolonged erections  -restlessness, pacing, inability to keep still  -seizures  -stiff muscles  -suicidal thoughts or other mood changes  -trouble sleeping  -unusual bleeding or bruising  -unusually weak or tired  -vomiting  Side effects that usually do not require medical attention (report to your doctor or health care professional if they continue or are bothersome):  -change in appetite or weight  -change in sex drive or performance  -diarrhea  -dry mouth  -headache  -increased sweating  -nausea  -tremors  This list may not describe all possible side effects. Call your doctor for medical advice about side effects. You may report side effects to FDA at 1-800-FDA-1088.  Where should I keep my medicine?  Keep out of the reach of children.  Store at room temperature between 15 and 30 degrees C (59 and 86 degrees F). Throw away any unused medicine after the expiration date.  NOTE: This sheet is a summary. It may not cover all possible information. If you have questions about this medicine, talk to your doctor, pharmacist, or health care provider.  © 2019 Elsevier/Gold Standard (2018-03-26 11:56:53)

## 2020-09-23 ENCOUNTER — Encounter (INDEPENDENT_AMBULATORY_CARE_PROVIDER_SITE_OTHER): Payer: Self-pay | Admitting: Nurse Practitioner

## 2020-09-23 NOTE — Assessment & Plan Note (Signed)
 Active  FM PHQ9 score 09/22/2020 12/15/2019   PHQ9 Patient Summary Score (calculated) 20 23     No longer taking Lexapro 2/2 ASE  Start Prozac 10 mg take ad  Refer to Psychology and Psychiatry for eval and treatment   Follow up 6 weeks prn

## 2020-09-23 NOTE — Assessment & Plan Note (Signed)
 Active  Last GAD7 score with date 09/22/2020 12/15/2019   GAD7 Patient Total 20 19   No longer taking Lexapro 2/2 ASE  Start Prozac 10 mg take ad  Refer to Psychology and Psychiatry for eval and treatment   Follow up 6 weeks prn

## 2020-10-09 ENCOUNTER — Ambulatory Visit (INDEPENDENT_AMBULATORY_CARE_PROVIDER_SITE_OTHER): Admitting: Nurse Practitioner

## 2020-11-14 ENCOUNTER — Other Ambulatory Visit (INDEPENDENT_AMBULATORY_CARE_PROVIDER_SITE_OTHER): Payer: Self-pay | Admitting: Nurse Practitioner

## 2020-11-14 DIAGNOSIS — F332 Major depressive disorder, recurrent severe without psychotic features: Secondary | ICD-10-CM

## 2020-11-14 DIAGNOSIS — F419 Anxiety disorder, unspecified: Secondary | ICD-10-CM

## 2020-11-16 NOTE — Telephone Encounter (Signed)
Needs follow up prior to refill.

## 2020-11-20 ENCOUNTER — Other Ambulatory Visit (INDEPENDENT_AMBULATORY_CARE_PROVIDER_SITE_OTHER): Payer: Self-pay | Admitting: Nurse Practitioner

## 2020-11-20 DIAGNOSIS — F332 Major depressive disorder, recurrent severe without psychotic features: Secondary | ICD-10-CM

## 2020-11-20 DIAGNOSIS — F419 Anxiety disorder, unspecified: Secondary | ICD-10-CM

## 2020-11-20 MED ORDER — FLUOXETINE HCL 10 MG OR CAPS
10.0000 mg | ORAL_CAPSULE | Freq: Every day | ORAL | 1 refills | Status: DC
Start: 2020-11-20 — End: 2020-11-21

## 2020-11-20 NOTE — Telephone Encounter (Signed)
 Pt called asking for refill on her Fluoxetine to be sent to [facility]. LOV 09/22/20

## 2020-11-21 ENCOUNTER — Encounter (INDEPENDENT_AMBULATORY_CARE_PROVIDER_SITE_OTHER): Payer: Self-pay | Admitting: Family Medicine

## 2020-11-21 DIAGNOSIS — F332 Major depressive disorder, recurrent severe without psychotic features: Secondary | ICD-10-CM

## 2020-11-21 DIAGNOSIS — F419 Anxiety disorder, unspecified: Secondary | ICD-10-CM

## 2020-11-21 MED ORDER — FLUOXETINE HCL 10 MG OR CAPS
10.0000 mg | ORAL_CAPSULE | Freq: Every day | ORAL | 1 refills | Status: DC
Start: 2020-11-21 — End: 2021-07-26

## 2021-03-01 ENCOUNTER — Telehealth (INDEPENDENT_AMBULATORY_CARE_PROVIDER_SITE_OTHER): Payer: Self-pay | Admitting: Nurse Practitioner

## 2021-04-05 ENCOUNTER — Telehealth (INDEPENDENT_AMBULATORY_CARE_PROVIDER_SITE_OTHER): Admitting: Physician Assistant

## 2021-04-05 ENCOUNTER — Encounter (INDEPENDENT_AMBULATORY_CARE_PROVIDER_SITE_OTHER): Payer: Self-pay | Admitting: Physician Assistant

## 2021-04-05 VITALS — Temp 96.5°F | Ht 64.37 in | Wt 165.0 lb

## 2021-04-12 ENCOUNTER — Ambulatory Visit (INDEPENDENT_AMBULATORY_CARE_PROVIDER_SITE_OTHER): Admitting: Nurse Practitioner

## 2021-07-26 ENCOUNTER — Other Ambulatory Visit (INDEPENDENT_AMBULATORY_CARE_PROVIDER_SITE_OTHER): Payer: Self-pay | Admitting: Family Medicine

## 2021-07-26 DIAGNOSIS — F419 Anxiety disorder, unspecified: Secondary | ICD-10-CM

## 2021-07-26 DIAGNOSIS — F332 Major depressive disorder, recurrent severe without psychotic features: Secondary | ICD-10-CM

## 2021-07-26 MED ORDER — FLUOXETINE HCL 10 MG OR CAPS
10.0000 mg | ORAL_CAPSULE | Freq: Every day | ORAL | 1 refills | Status: DC
Start: 2021-07-26 — End: 2022-05-07

## 2021-09-26 ENCOUNTER — Encounter (INDEPENDENT_AMBULATORY_CARE_PROVIDER_SITE_OTHER): Payer: Self-pay

## 2021-09-26 ENCOUNTER — Ambulatory Visit (INDEPENDENT_AMBULATORY_CARE_PROVIDER_SITE_OTHER): Admitting: Nurse Practitioner

## 2022-05-07 ENCOUNTER — Other Ambulatory Visit (INDEPENDENT_AMBULATORY_CARE_PROVIDER_SITE_OTHER): Payer: Self-pay | Admitting: Family Medicine

## 2022-05-07 ENCOUNTER — Encounter (INDEPENDENT_AMBULATORY_CARE_PROVIDER_SITE_OTHER): Payer: Self-pay

## 2022-05-07 DIAGNOSIS — F419 Anxiety disorder, unspecified: Secondary | ICD-10-CM

## 2022-05-07 DIAGNOSIS — F332 Major depressive disorder, recurrent severe without psychotic features: Secondary | ICD-10-CM

## 2022-05-07 MED ORDER — FLUOXETINE HCL 10 MG OR CAPS
ORAL_CAPSULE | ORAL | 0 refills | Status: DC
Start: 2022-05-07 — End: 2023-03-13

## 2022-05-07 NOTE — Telephone Encounter (Signed)
LVMTCB

## 2022-05-07 NOTE — Telephone Encounter (Signed)
Due for annual medication review  LOV 09/22/2020 for depression.   Appointment due prior to next refill request fwd to scheduling , MyChart message sent to patient   Medication(s) request refilled per protocol   Name of medication(s): FLUoxetine (PROZAC) 10 MG capsule

## 2022-11-14 ENCOUNTER — Encounter (INDEPENDENT_AMBULATORY_CARE_PROVIDER_SITE_OTHER): Admitting: Nurse Practitioner

## 2023-03-13 ENCOUNTER — Ambulatory Visit (INDEPENDENT_AMBULATORY_CARE_PROVIDER_SITE_OTHER): Admitting: Family Practice

## 2023-03-13 VITALS — BP 126/60 | HR 70 | Temp 98.5°F | Resp 16 | Ht 64.37 in | Wt 147.0 lb

## 2023-03-13 NOTE — Progress Notes (Signed)
[place]-  [place] - [place] - [place]   [email or web address] and [email or web address]  Telephone: [phone]         CONCERN     Encounter Date:  03/13/23  4:56 PM   PCP: Loel Ro (Inactive)  MRN: [id number]   DOB: [birth year]     CONCERN:  Hannah Lloyd is a 42 year old female presents   Chief Complaint   Patient presents with    Ankle Pain     42 y/o female pt presents ITO for R ankle pain since last - possibly twisting after trying to avoid stepping on a dog.     HPI:    #right foot pain  C/o right ankle pain since yesterday  Pt walked over the dog to avoid stepping on ti but patient twisted her ankle and heard a "pop" sound  Reports the pain radiated through right leg  Reports welling and of foot and burning sensation of knee  Pt took ibuprofen this morning  Pt tried to avoid putting weight on it  Pt would be on her feet regularly at work  Has been wearing ace bandage with improvement    Other chronic conditions, if discussed, are documented below in the A/P  Problem List and Family history in the EMR have been reviewed and updated as appropriate    ASSESSMENT/   PLAN       Hannah Lloyd was seen today for ankle pain.    Diagnoses and all orders for this visit:    Right foot pain  Assessment & Plan:  Active  Suspects partial achilles tendon strain  Recommend warm/cold compresses, IcyHot/Bengay/Biofreeze, elevation sleep in a comfortable position.  Avoid strenuous activity/heavy lifting   Continue wearing ace bandage at this time  Recommend NSAIDs  Work note is written to patient from today 03/13/2023 to 03/21/2023  F/u as needed   Continue to monitor               ICD-10-CM ICD-9-CM    1. Right foot pain  M79.671 729.5               SUPPORTING OBJECTIVE  / SUBJECTIVE     PHYSICAL EXAM:   03/13/23  1647   BP: 126/60   Pulse: 70   Temp: 98.5 F (36.9 C)   Resp: 16   SpO2: 99%     Ht Readings from Last 1 Encounters:   03/13/23 5' 4.37" (1.635 m)     Wt Readings from Last 1 Encounters:    03/13/23 66.7 kg (147 lb)     Body mass index is 24.94 kg/m.        03/13/2023     4:47 PM 04/05/2021     1:12 PM 09/22/2020     4:45 PM 08/08/2020     2:28 PM 01/13/2020     3:26 PM 12/29/2019     9:20 AM   Date Weight Recorded   Metric 66.679 kg 74.844 kg 73.573 kg 74.844 kg 75.388 kg 73.392 kg   Pounds/Ounces 147 lb 165 lb 162 lb 3.2 oz 165 lb 166 lb 3.2 oz 161 lb 12.8 oz        Physical Exam  Vitals and nursing note reviewed.   Constitutional:       Appearance: Normal appearance.   HENT:      Right Ear: External ear normal.      Left Ear: External ear normal.   Eyes:      Pupils: Pupils are  equal, round, and reactive to light.   Cardiovascular:      Rate and Rhythm: Normal rate.   Pulmonary:      Effort: Pulmonary effort is normal.   Musculoskeletal:      Cervical back: Normal range of motion.      Right foot: Decreased range of motion. Swelling and tenderness present.      Comments: Severe pain to flexion and extension of right foot   Skin:     General: Skin is warm and dry.   Neurological:      General: No focal deficit present.      Mental Status: She is alert and oriented to person, place, and time. Mental status is at baseline.   Psychiatric:         Mood and Affect: Mood normal.         Behavior: Behavior normal.         Thought Content: Thought content normal.         Judgment: Judgment normal.           ALLERGY / MEDICATIONS / SURGICAL  HISTORY /  SOCIAL HISTORY / IMMUNICATIONS / ROS     ALLERGIES:  No Known Allergies       CURRENT  MEDICATIONS:  No outpatient medications have been marked as taking for the 03/13/23 encounter (Office Visit) with Ludger Nutting, MD.          PAST SURGICAL HISTORY:  No past surgical history on file.     SOCIAL HISTORY:  Social History     Socioeconomic History    Marital status: Separated   Tobacco Use    Smoking status: Never    Smokeless tobacco: Never   Substance and Sexual Activity    Alcohol use: Not Currently    Drug use: Never       Immunization History   Administered  Date(s) Administered    COVID-19 [employer]) Purple Cap >= 12 Years 03/11/2020, 04/01/2020         REVIEW OF SYSTEMS:   (unless listed below, the ROS is negative except as listed in HPI)  Review of Systems   All other systems reviewed and are negative.        By signing below, I acknowledge that I have reviewed the above note,  dictated by me and scribed by Patrick North, for accuracy and edited  where necessary. The note accurately reflects the services provided at this  encounter. We retain the right to modify this information in the event of  errors. Occasional errors in punctuation, grammar and content may occur.         [Electronically reviewed and signed by Dr. Ludger Nutting, MD of [facility]]     [facility]  [email or web address]

## 2023-03-13 NOTE — Assessment & Plan Note (Addendum)
Active  Suspects partial achilles tendon strain  Recommend warm/cold compresses, IcyHot/Bengay/Biofreeze, elevation sleep in a comfortable position.  Avoid strenuous activity/heavy lifting   Continue wearing ace bandage at this time  Recommend NSAIDs  Work note is written to patient from today 03/13/2023 to 03/21/2023  F/u as needed   Continue to monitor

## 2023-03-18 ENCOUNTER — Telehealth (INDEPENDENT_AMBULATORY_CARE_PROVIDER_SITE_OTHER): Payer: Self-pay | Admitting: Family Practice

## 2023-03-18 NOTE — Telephone Encounter (Signed)
Thank you for letting me know. I have sent her a letter via mychart.

## 2023-03-18 NOTE — Telephone Encounter (Signed)
Pt called requesting to get a return to work note allowing her to return sooner than 03/24/23 with some restrictions. 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 States she is fine, & sometimes has pain when she is walking a lot, but it is not too bad. She also reports she has been alternating the tylenol & ibuprofen which has been helping.

## 2023-04-29 ENCOUNTER — Encounter (INDEPENDENT_AMBULATORY_CARE_PROVIDER_SITE_OTHER)

## 2023-08-22 ENCOUNTER — Ambulatory Visit (INDEPENDENT_AMBULATORY_CARE_PROVIDER_SITE_OTHER): Admitting: Physician Assistant

## 2023-08-22 VITALS — BP 132/76 | HR 90 | Temp 98.5°F | Resp 15 | Ht 64.37 in

## 2023-08-22 LAB — INFLUENZA A&B TEST POCT: Influenza A/B Combined: NEGATIVE

## 2023-08-22 NOTE — Progress Notes (Signed)
 Subjective:   Hannah Lloyd is a 43 year old female who is here for Cough (night sweats/cough/body aches/watery eyes, L ear feels plugged x2 days//Pt has been taking mucinex and pedia light ) and Refill Request (Refill on prozac  )      Cough  Associated symptoms include chills, a fever, headaches, myalgias and a sore throat. Pertinent negatives include no chest pain, ear pain, rash or shortness of breath.        See CC   In no acute distress O/W well appearing     Review of Systems   Constitutional:  Positive for chills and fever.        Pt denies dyspnea, chest pain/pressure, orthostasis, dizziness, falls, hypotension, mental status change, observed cyanosis, and decreased urine output.     HENT:  Positive for congestion and sore throat. Negative for ear discharge and ear pain.    Respiratory:  Positive for cough. Negative for shortness of breath.    Cardiovascular:  Negative for chest pain, palpitations and leg swelling.   Musculoskeletal:  Positive for myalgias.   Skin:  Negative for rash.   Neurological:  Positive for headaches. Negative for dizziness, tremors, seizures, syncope, facial asymmetry, speech difficulty, weakness, light-headedness and numbness.   Psychiatric/Behavioral:  Negative for self-injury and suicidal ideas.    All other systems reviewed and are negative.       ROS negative other than what is stated in HPI       No Known Allergies    Reviewed patients pertinent information related to social history, past medical, past surgical, and family history.     Objective:  Vital signs: BP 132/76 (BP Location: Right arm, BP Patient Position: Sitting, BP cuff size: Regular)   Pulse 90   Temp 98.5 F (36.9 C) (Temporal)   Resp 15   Ht 5' 4.37" (1.635 m)   SpO2 99%   BMI 24.94 kg/m     Physical Exam  Vitals and nursing note reviewed.   Constitutional:       General: She is not in acute distress.     Appearance: Normal appearance. She is well-developed. She is not ill-appearing, toxic-appearing  or diaphoretic.   HENT:      Head: Normocephalic.      Right Ear: Tympanic membrane, ear canal and external ear normal.      Left Ear: Tympanic membrane, ear canal and external ear normal.      Nose: Congestion and rhinorrhea present.      Mouth/Throat:      Mouth: Mucous membranes are moist.      Pharynx: No oropharyngeal exudate.      Comments: Oropharynx is erythematous without tonsilar exudates   Uvula is midline  Cardiovascular:      Rate and Rhythm: Normal rate and regular rhythm.      Heart sounds: Normal heart sounds. No murmur heard.     No friction rub. No gallop.   Pulmonary:      Effort: Pulmonary effort is normal. No respiratory distress.      Breath sounds: Normal breath sounds. No stridor. No wheezing, rhonchi or rales.   Chest:      Chest wall: No tenderness.   Lymphadenopathy:      Cervical: No cervical adenopathy.   Skin:     Coloration: Skin is not jaundiced.      Findings: No rash.   Neurological:      Mental Status: She is alert and oriented to person, place, and time.  Psychiatric:         Behavior: Behavior normal.         Thought Content: Thought content normal.         Judgment: Judgment normal.           Assessment/Plan:  Kamarii was seen today for cough and refill request.    Diagnoses and all orders for this visit:    Flu-like symptoms  -     INFLUENZA A&B TEST POCT       Stable  Influenza test was negative   Stay hydrated with water and Pedialyte   Tylenol or Ibuprofen and cool baths or fever   If fever not responding to treatment and cooling measures return to clinic or ER  ER precautions for any severe SOB or CP   Return to clinic if sx worsen or do not improve in 3-4 days  Make appt with follow up with pcp       Diagnosed with:    ICD-10-CM ICD-9-CM    1. Flu-like symptoms  R68.89 780.99 INFLUENZA A&B TEST POCT          Electronically signed and reviewed by Jodee Mulling PA-C

## 2023-10-24 ENCOUNTER — Telehealth (INDEPENDENT_AMBULATORY_CARE_PROVIDER_SITE_OTHER)

## 2023-10-24 VITALS — Wt 145.0 lb

## 2023-10-24 MED ORDER — FLUOXETINE HCL 10 MG OR CAPS
10.0000 mg | ORAL_CAPSULE | Freq: Every day | ORAL | 3 refills | Status: AC
Start: 2023-10-24 — End: ?
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
## 2023-10-24 NOTE — Assessment & Plan Note (Signed)
 Stable  Routine labs  F/u with results  monitor

## 2023-10-24 NOTE — Assessment & Plan Note (Signed)
 Stable   Denies SI/HI  reSTART prozac  10 mg  A healthy diet and exercise regimen are advised for optimal health and assists in depression management.   Suggest maintaining a journal to better manage depression.  Advised coping methods: breathing techniques, meditation.  Recommended listening to audiobooks in place of daily news   Encouraged to build up to 1 hour of low-level cardio exercise  F/u in 1 month  Continue to monitor.      If you ever feel like you might hurt yourself or someone else, do one of these things:   Call your doctor or nurse and tell them it is urgent   Call for an ambulance (in the [place]  and [place], dial 911)  Go to the emergency room at your local hospital   Call the National Suicide Prevention Lifeline: dial 988

## 2023-11-05 ENCOUNTER — Telehealth (INDEPENDENT_AMBULATORY_CARE_PROVIDER_SITE_OTHER): Payer: Self-pay

## 2023-11-05 NOTE — Telephone Encounter (Signed)
 Patient Triage call:      Good afternoon [place], I have MRN: [id number] Hannah Lloyd calling in c/o shakiness/SOB/dizziness since starting Prozac  medication. She took her dose this morning and is at work currently feeling like she is about to pass out. Please advise thank you. Laruth Andrews Raquel Eppie        Per Dr. Tilson   please have the patient drink some water, lie down and try to relax as much as possible, if she's not feeling better in an hour then have her go to UC- per dr bentley        Pt advised and she verbalized understanding.

## 2023-11-05 NOTE — Result Encounter Note (Signed)
 Your mammogram is normal and we recommended routine follow-up yearly.  Sincerely,   Kirkland Hun, FNP  11/05/2023

## 2023-11-06 ENCOUNTER — Telehealth (INDEPENDENT_AMBULATORY_CARE_PROVIDER_SITE_OTHER): Admitting: Family

## 2023-11-06 ENCOUNTER — Ambulatory Visit (INDEPENDENT_AMBULATORY_CARE_PROVIDER_SITE_OTHER): Admitting: Family

## 2023-11-17 ENCOUNTER — Other Ambulatory Visit (INDEPENDENT_AMBULATORY_CARE_PROVIDER_SITE_OTHER): Payer: Self-pay | Admitting: Family Practice

## 2023-11-17 ENCOUNTER — Encounter (INDEPENDENT_AMBULATORY_CARE_PROVIDER_SITE_OTHER): Payer: Self-pay | Admitting: Family Practice

## 2023-11-17 ENCOUNTER — Ambulatory Visit (INDEPENDENT_AMBULATORY_CARE_PROVIDER_SITE_OTHER): Admitting: Family Practice

## 2023-11-17 VITALS — BP 130/72 | HR 81 | Temp 97.8°F | Resp 16 | Ht 64.37 in | Wt 149.8 lb

## 2023-11-17 DIAGNOSIS — M549 Dorsalgia, unspecified: Secondary | ICD-10-CM

## 2023-11-17 DIAGNOSIS — Z87442 Personal history of urinary calculi: Secondary | ICD-10-CM

## 2023-11-17 LAB — UA, CHEM ONLY POCT
Bilirubin: NEGATIVE
Glucose: NEGATIVE
Nitrite: NEGATIVE
Specific Gravity: 1.015 (ref 1.002–1.030)
Urobilinogen: 0.2 mg/dL (ref 0.2–1.0)
pH: 7.5 (ref 5.00–8.00)

## 2023-11-17 MED ORDER — TRAMADOL HCL 50 MG OR TABS
50.0000 mg | ORAL_TABLET | Freq: Four times a day (QID) | ORAL | 0 refills | Status: AC | PRN
Start: 2023-11-17 — End: ?

## 2023-11-17 MED ORDER — TAMSULOSIN HCL 0.4 MG PO CAPS
0.4000 mg | ORAL_CAPSULE | Freq: Every day | ORAL | 0 refills | Status: AC
Start: 2023-11-17 — End: ?

## 2023-11-17 MED ORDER — CEPHALEXIN 500 MG OR CAPS
500.0000 mg | ORAL_CAPSULE | Freq: Two times a day (BID) | ORAL | 0 refills | Status: AC
Start: 2023-11-17 — End: 2023-11-24

## 2023-11-17 NOTE — Assessment & Plan Note (Signed)
 Active   Hx of kidney stone (passed on its own) with the same sx of n/v and lower back pain.   Ordered STAT CT Renal Stone Protocol to confirm kidney stone   Prescribed Flomax  0.4 mg daily for 4 weeks and Tramadol  50 mg q6h as needed for severe pain.   Monitor

## 2023-11-17 NOTE — Assessment & Plan Note (Signed)
 Active  UA in clinic is positive for leuks and ketones. Sent urine to culture.  Prescribed Keflex 500 mg twice a day x 7 days  Monitor

## 2023-11-17 NOTE — Progress Notes (Signed)
 [place] - [place]-  [place] - [place] - [place]   [email or web address]  [email or web address]   Telephone: [phone]      CONCERN     Encounter Date:  Monday November 17, 2023  2:20 PM PDT    PCP: Donalee Gaudy  MRN: [id number]   DOB: [birth year]     HPI:   Hannah Lloyd is a 43 year old female presents for:   Chief Complaint   Patient presents with    Stones     Patient is having sx of vomiting and states her body feels inflamed after vomit and sweating  Has hx of kidney stones     Patient reports having right lower back pain with dysuria since this morning.  Notes hx of kidney stones (passed on its own) and the pain is comparable to when she had a stone.   States having n/v this morning and has a burning sensation after she vomits.   Pt is currently on her period   Took a Norco medication in the past for the pain - states this made her a little drowsy  Denies fevers/chills    Other chronic conditions, if discussed, are documented below in the A/P  Problem List and Family history in the EMR have been reviewed and updated as appropriate    ASSESSMENT/   PLAN       Hannah Lloyd was seen today for stones.    Diagnoses and all orders for this visit:    Acute UTI  Assessment & Plan:  Active  UA in clinic is positive for leuks and ketones. Sent urine to culture.  Prescribed Keflex  500 mg twice a day x 7 days  Monitor     Orders:  -     UA, Chem Only (POCT)  -     cephALEXin  (KEFLEX ) 500 MG capsule; Take 1 capsule (500 mg) by mouth 2 times daily for 7 days.  -     Urine Culture - See Instructions    Costovertebral angle tenderness  Assessment & Plan:  Active   Hx of kidney stone (passed on its own) with the same sx of n/v and lower back pain.   Ordered STAT CT Renal Stone Protocol to confirm kidney stone   Prescribed Flomax  0.4 mg daily for 4 weeks and Tramadol  50 mg q6h as needed for severe pain.   Monitor     Orders:  -     traMADol  (ULTRAM ) 50 MG tablet; Take 1 tablet (50 mg) by mouth every 6  hours as needed for Severe Pain (Pain Score 7-10).  -     tamsulosin  (FLOMAX ) 0.4 MG capsule; Take 1 capsule (0.4 mg) by mouth daily.  -     CT Renal Stone Protocol  -     CURES Review Documentation - I Reviewed CURES    History of kidney stones  Assessment & Plan:  Active   Hx of kidney stone (passed on its own) with the same sx of n/v and lower back pain.   Ordered STAT CT Renal Stone Protocol to confirm kidney stone   Prescribed Flomax  0.4 mg daily for 4 weeks and Tramadol  50 mg q6h as needed for severe pain.   Monitor       Orders:  -     tamsulosin  (FLOMAX ) 0.4 MG capsule; Take 1 capsule (0.4 mg) by mouth daily.  -     CT Renal Stone Protocol  -     CURES Review Documentation - I Reviewed CURES  ICD-10-CM ICD-9-CM    1. Acute UTI  N39.0 599.0 UA, Chem Only (POCT)      cephALEXin  (KEFLEX ) 500 MG capsule      Urine Culture - See Instructions      2. Costovertebral angle tenderness  M54.9 724.5 traMADol  (ULTRAM ) 50 MG tablet      tamsulosin  (FLOMAX ) 0.4 MG capsule      CT Renal Stone Protocol      CURES Review Documentation - I Reviewed CURES      3. History of kidney stones  Z87.442 V13.01 tamsulosin  (FLOMAX ) 0.4 MG capsule      CT Renal Stone Protocol      CURES Review Documentation - I Reviewed CURES              SUPPORTING OBJECTIVE  / SUBJECTIVE     PHYSICAL EXAM:   11/17/23  1438   BP: 130/72   Pulse: 81   Temp: 97.8 F (36.6 C)   Resp: 16   SpO2: 98%     Ht Readings from Last 1 Encounters:   11/17/23 5' 4.37 (1.635 m)     Wt Readings from Last 1 Encounters:   11/17/23 67.9 kg (149 lb 12.8 oz)     Body mass index is 25.42 kg/m.        11/17/2023     2:38 PM 10/24/2023     9:43 AM 03/13/2023     4:47 PM 04/05/2021     1:12 PM 09/22/2020     4:45 PM 08/08/2020     2:28 PM   Date Weight Recorded   Metric 67.949 kg 65.772 kg 66.679 kg 74.844 kg 73.573 kg 74.844 kg   Pounds/Ounces 149 lb 12.8 oz 145 lb 147 lb 165 lb 162 lb 3.2 oz 165 lb        Physical Exam  Vitals and nursing note reviewed.   Constitutional:        Appearance: Normal appearance.   HENT:      Head: Normocephalic and atraumatic.   Eyes:      Extraocular Movements: Extraocular movements intact.      Pupils: Pupils are equal, round, and reactive to light.   Cardiovascular:      Rate and Rhythm: Normal rate and regular rhythm.      Pulses: Normal pulses.      Heart sounds: Normal heart sounds. No murmur heard.     No gallop.   Pulmonary:      Effort: Pulmonary effort is normal. No respiratory distress.      Breath sounds: Normal breath sounds. No wheezing or rales.   Abdominal:      Tenderness: There is right CVA tenderness (mild). There is no left CVA tenderness.   Neurological:      General: No focal deficit present.      Mental Status: She is alert. Mental status is at baseline.   Psychiatric:         Mood and Affect: Mood normal.         Behavior: Behavior normal.           ALLERGY / MEDICATIONS / SURGICAL  HISTORY /  SOCIAL HISTORY / IMMUNICATIONS / ROS     ALLERGIES:  No Known Allergies       CURRENT  MEDICATIONS:  Outpatient Medications Marked as Taking for the 11/17/23 encounter (Office Visit) with Gib Harlene Gift, DO   Medication Sig Dispense Refill    FLUoxetine  (PROZAC ) 10 MG capsule Take 1  capsule (10 mg) by mouth daily. 90 capsule 3          PAST SURGICAL HISTORY:  No past surgical history on file.     SOCIAL HISTORY:  Socioeconomic History    Marital status: Separated   Tobacco Use    Smoking status: Never    Smokeless tobacco: Never   Substance and Sexual Activity    Alcohol use: Not Currently    Drug use: Never       Immunization History   Administered Date(s) Administered    COVID-19 (Moderna) Bivalent Vaccine >= 12 years 11/02/2021    COVID-19 (Moderna) Red Cap >= 12 Years 12/08/2020    COVID-19 Autonation) Purple Cap >= 12 Years 03/11/2020, 04/01/2020         REVIEW OF SYSTEMS:   (unless listed below, the ROS  is negative except as listed in HPI)  Review of Systems   Constitutional:  Negative for fever.   Respiratory:  Negative for cough  and shortness of breath.    Cardiovascular:  Negative for chest pain, palpitations and leg swelling.   Gastrointestinal:  Negative for abdominal pain.   Neurological:  Negative for headaches.   All other systems reviewed and are negative.            By signing below, I acknowledge that I have reviewed the above note,  dictated by me and scribed by Ames Blas, for accuracy and edited  where necessary. The note accurately reflects the services provided at this encounter. We retain the right to modify this information in the event of errors. Occasional errors in punctuation, grammar and content may occur.      Electronically signed by Harlene Mallie [place], DO      [facility]  [email or web address]

## 2023-11-17 NOTE — Assessment & Plan Note (Addendum)
 Active   Hx of kidney stone (passed on its own) with the same sx of n/v and lower back pain.   Ordered STAT CT Renal Stone Protocol to confirm kidney stone   Prescribed Flomax  0.4 mg daily for 4 weeks and Tramadol  50 mg q6h as needed for severe pain.   Monitor

## 2023-11-18 ENCOUNTER — Telehealth (INDEPENDENT_AMBULATORY_CARE_PROVIDER_SITE_OTHER): Payer: Self-pay | Admitting: Family Practice

## 2023-11-18 LAB — URINE CULTURE

## 2023-11-18 NOTE — Telephone Encounter (Signed)
 Patient would also like something for vomiting and nausea please advise

## 2023-11-18 NOTE — Telephone Encounter (Addendum)
 Patient had an appt 03/31 and states she needs a work note to excuse her from yesterday and today she is still in pain , would like a call back with any status

## 2023-11-19 ENCOUNTER — Telehealth (INDEPENDENT_AMBULATORY_CARE_PROVIDER_SITE_OTHER)

## 2023-11-19 VITALS — Ht 64.0 in | Wt 149.7 lb

## 2023-11-19 MED ORDER — ONDANSETRON 4 MG OR TBDP
4.0000 mg | ORAL_TABLET | Freq: Four times a day (QID) | ORAL | 0 refills | Status: AC | PRN
Start: 2023-11-19 — End: ?

## 2023-11-19 NOTE — Assessment & Plan Note (Signed)
 Active   Hx of kidney stone (passed on its own) with the same sx of n/v and lower back pain.   Ordered STAT CT Renal Stone Protocol to confirm kidney stone - advised patient to completed  Continue Flomax 0.4 mg daily for 4 weeks and Tramadol 50 mg q6h as needed for severe pain.   Monitor

## 2023-11-19 NOTE — Telephone Encounter (Signed)
 Pt has tele/ 04/01. Pa Hannah Lloyd.

## 2023-11-19 NOTE — Progress Notes (Signed)
 [place] - [place]-  [place] - [place] - [place]   [email or web address] and [email or web address]  Telephone: [phone]                 Encounter Date:  11/19/23  4:09 PM   PCP: Hannah Lloyd   MRN: [id number]  DOB: 06-Nov-1980    [facility] TELEPHONE SERVICES ENCOUNTER    Evaluator(s):   Hannah Lloyd is a 43 year old female  who was evaluated by: Physician Assistant (via telemedicine)    Statement:    A Relevant History (including allergies, medications, past medical history, relevant review of systems) and relevant exam as performed by the named provider, are as transcribed in this and/or the accompanying note. Please also see the patient questionnaire for details.      CONCERN:     VISIT TYPE: URGENT CARE CLINIC      HPI:  Hannah Lloyd is a 43 year old female presents at Home for the following:   Chief Complaint   Patient presents with    Sickness     Pt has been throwing up non stop pt can even keep water down ice cubes seems to help. Pt also states her body feels in flames and starts to sweats pt also has cough     Patient seen by Dr. Marcelina Lloyd on 3/31 for right low back pain and dysuria  Patient has history of kidney stones that passed spontaneously, reports similar symptoms now  Has been experiencing N/V for the past 3 days - unable to keep anything down   Notes there is a pink/brown tinge to vomit, denies bright red blood   Denies fevers, has been checking temperature often  Believes she has been able to keep keflex antibiotic down    Patient believes she passed a kidney stone - looked like a white cluster, was painful   Has been taking flomax, tramadol   Has not been able to complete CT yet    Urine culture returned mixed flora:        ASSESSMENT AND PLAN       Hannah Lloyd was seen today for sickness.    Diagnoses and all orders for this visit:    Nausea and vomiting, unspecified vomiting type  Assessment & Plan:  Active, severe  Vomiting heard on telehealth call  Start  zofran q6 hours as needed   Discussed ED precautions - no improvement with oral zofran, persistent discolored vomit, bright red blood, chest or stomach pain, worsened pelvic or flank pain  Discussed risk for esophageal tear with recurrent vomiting  Suspect 2/2 to nephrolithiasis  Consider pyelonephritis; however, negative urine culture, no fevers   Finish antibiotics to completion  Follow up if no improvement in 1-2 days     Orders:  -     ondansetron (ZOFRAN ODT) 4 MG disintegrating tablet; Take 1 tablet (4 mg) on or under the tongue every 6 hours as needed for Nausea/Vomiting.    History of kidney stones  Assessment & Plan:  Active   Hx of kidney stone (passed on its own) with the same sx of n/v and lower back pain.   Ordered STAT CT Renal Stone Protocol to confirm kidney stone - advised patient to completed  Continue Flomax 0.4 mg daily for 4 weeks and Tramadol 50 mg q6h as needed for severe pain.   Monitor               ICD-10-CM ICD-9-CM    1. Nausea and vomiting,  unspecified vomiting type  R11.2 787.01 ondansetron (ZOFRAN ODT) 4 MG disintegrating tablet      2. History of kidney stones  Z87.442 V13.01           Patient instructions:  See EPIC instructions.  Reviewed verbally and/or AVS available via MYchart for patient.      Barriers to learning assessed: None.    Patient/family verbalizes understanding and is agreeable to above plan.    Patient was evaluated by Hannah Alanis PA-C   at [facility] [place]      TIME SPENT ON MEDICAL DISCUSSION INCLUDING BEFORE, DURING, AND AFTER PHONE CONVERSATION: 15 min  Start time: 4:10  Stop time: 4:25    PHYSICAL EXAM       There were no vitals filed for this visit.  Body mass index is 25.69 kg/m.    Ht Readings from Last 1 Encounters:   11/19/23 5\' 4"  (1.626 m)     Wt Readings from Last 1 Encounters:   11/19/23 67.9 kg (149 lb 11.1 oz)       General Impression: No physical exam done. Sounds healthy, alert, no distress, pleasant affect, cooperative, communicative. Speech  within normal limits.                  Problem list, Fam History and PMHx reviewed in EMR    SOCIAL HISTORY:  Social History     Socioeconomic History    Marital status: Separated     Spouse name: Not on file    Number of children: Not on file    Years of education: Not on file    Highest education level: Not on file   Occupational History    Not on file   Tobacco Use    Smoking status: Never    Smokeless tobacco: Never   Substance and Sexual Activity    Alcohol use: Not Currently    Drug use: Never    Sexual activity: Not on file   Other Topics Concern    Not on file   Social History Narrative    Not on file     Social Determinants of Health     Financial Resource Strain: Not on file   Food Insecurity: Not on file   Transportation Needs: Not on file   Physical Activity: Not on file   Stress: Not on file   Social Connections: Not on file   Intimate Partner Violence: Not on file   Housing Stability: Not on file     Immunization History   Administered Date(s) Administered    COVID-19 (Moderna) Bivalent Vaccine >= 12 years 11/02/2021    COVID-19 (Moderna) Red Cap >= 12 Years 12/08/2020    COVID-19 [employer]) Purple Cap >= 12 Years 03/11/2020, 04/01/2020         CURRENT  MEDICATIONS:  Outpatient Medications Marked as Taking for the 11/19/23 encounter [facility] Telemedicine) with Hannah Lloyd   Medication Sig Dispense Refill    cephALEXin (KEFLEX) 500 MG capsule Take 1 capsule (500 mg) by mouth 2 times daily for 7 days. 14 capsule 0    FLUoxetine (PROZAC) 10 MG capsule Take 1 capsule (10 mg) by mouth daily. 90 capsule 3    tamsulosin (FLOMAX) 0.4 MG capsule Take 1 capsule (0.4 mg) by mouth daily. 30 capsule 0    traMADol (ULTRAM) 50 MG tablet Take 1 tablet (50 mg) by mouth every 6 hours as needed for Severe Pain (Pain Score 7-10). 30 tablet 0  ALLERGIES:    No Known Allergies       REVIEW OF SYSTEMS:  Review of Systems  Negative except as noted in HPI      Patient Verification & Telemedicine Consent:      -I have  verified that the patient's identification to be correct via verbal confirmation of birth date and address & valid: Yes    -The patient, and / or surrogate, has been made aware that patient is to be evaluated today using a home telemedicine visit technique (audio transmission): Yes    - The patient, and / or surrogate, has been made aware that patient has the right to refuse this type of evaluation at any time during the assessment period, and has been made aware of any alternatives to this type of evaluation: Yes    -The patient, and / or surrogate,  has been made aware that patient may need further evaluations in the future: Yes    -The patient, and / or surrogate, has signed a valid Informed Consent document (detailing risks, benefits, alternatives & costs), or is exempt from these requirements by law, which I verify is currently present in the [facility]        [facility]  [email or web address]

## 2023-11-19 NOTE — Assessment & Plan Note (Addendum)
 Active, severe  Vomiting heard on telehealth call  Start zofran q6 hours as needed   Discussed ED precautions - no improvement with oral zofran, persistent discolored vomit, bright red blood, chest or stomach pain, worsened pelvic or flank pain  Discussed risk for esophageal tear with recurrent vomiting  Suspect 2/2 to nephrolithiasis  Consider pyelonephritis; however, negative urine culture, no fevers   Finish antibiotics to completion  Follow up if no improvement in 1-2 days

## 2023-11-21 NOTE — Telephone Encounter (Signed)
 Addressed during LOV 4/2 with Yvonna Alanis.      Chip Boer, DO  Family Medicine  [facility] - [place]

## 2023-11-27 ENCOUNTER — Telehealth (INDEPENDENT_AMBULATORY_CARE_PROVIDER_SITE_OTHER): Payer: Self-pay | Admitting: Family Practice

## 2023-11-27 NOTE — Telephone Encounter (Addendum)
 Informed patient of CT result finding - Obstructing 10 mm stone in distal right ureter causing mild obstructive uropathy. I advised patient to report directly to the ER due to concern of continued obstruction. Patient does note that although the pain has lessened in intensity, she is unable to eat very much or keep much food down. She voices understanding and will go to the ER today once she arranges care for her children.      Hannah Kerbs, DO  Family Medicine  [facility] - [place]

## 2023-11-27 NOTE — Telephone Encounter (Signed)
 Patient wanted to let providers know she will be going to [facility] ER

## 2023-11-27 NOTE — Telephone Encounter (Signed)
 TVI call to see if we received CT Renal Stone for Patient she stated they were critical results are for provider to review can you please have an available provider review Sent teams message to Lead Branda informing her of imaging thank you

## 2023-11-28 ENCOUNTER — Encounter (INDEPENDENT_AMBULATORY_CARE_PROVIDER_SITE_OTHER): Payer: Self-pay | Admitting: Hospital

## 2023-11-28 NOTE — Telephone Encounter (Signed)
 Will further discuss at pt appointment

## 2023-11-28 NOTE — Telephone Encounter (Signed)
 Patient following up. She states she went to [facility] and they only took her BP and sent her away. Patient is scheduled 12/04/23 w/ Dr. Halford Levels for follow up.

## 2023-12-01 ENCOUNTER — Ambulatory Visit (INDEPENDENT_AMBULATORY_CARE_PROVIDER_SITE_OTHER): Admitting: Family Practice

## 2023-12-01 VITALS — BP 102/72 | HR 85 | Temp 98.0°F | Resp 16 | Ht 64.0 in | Wt 147.0 lb

## 2023-12-01 LAB — INFLUENZA A&B TEST POCT: Influenza A/B Combined: NEGATIVE

## 2023-12-01 LAB — COVID-19 ANTIGEN POCT: COVID-19 Antigen (POCT): DETECTED

## 2023-12-01 NOTE — Assessment & Plan Note (Addendum)
 Active   Patient presented with URI symptoms   Positive POCT COVID testing   Negative influenza A&B POCT test   Work note written. Dates: 4/14-4/17  Plan for supportive care  Recommend proper fluid intake and rest  Continue home isolation until at least 5 days have passed since your symptoms first appeared    ED precautions given.  Monitor   F/u if symptoms worsen or persist

## 2023-12-01 NOTE — Progress Notes (Signed)
 [place] - [place]-  [place] - [place] - [place]   [email or web address]  [email or web address]   Telephone: [phone]      CONCERN     Encounter Date:  Monday December 01, 2023  1:00 PM PDT   PCP: Carlus Chihuahua  MRN: [id number]   DOB: May 26, 1981     CONCERN:   Adalyna Godbee is a 43 year old female presents   Chief Complaint   Patient presents with    Cough    Other     Req letter off work for today     HPI    Patient here for ER follow up   Hospital - [facility]  Date -  11/27/23    Dx - Kidney Stone     43 year old female sent in by PCP. Reportedly about a week ago she had an episode of back pain with some vomiting and nausea. She had an outpatient CT ordered today which she has brought the results with her showing a 10 mm right-sided stone with hydronephrosis. Patient is otherwise systemically well. She has no nausea or vomiting or ongoing pain. Patient states she actually had most of her symptoms resolved and improved over the past few days and at this time is otherwise well-appearing and asymptomatic. Reportedly she was told by her doctor she may need to come in for an emergent surgery however in reviewing the patient's imaging as well as discussing with her symptoms are now resolved I do think she would be appropriate for outpatient urology follow-up and evaluation of which she would greatly prefer as well. She has a PPO and is familiar with healthcare system and feels comfortable with scheduling her own appointment tomorrow. Strict return precautions provided encouraged any intractable nausea or vomiting, increasing weakness or fevers which she is understanding of.      HPI Today   Patient reports occasional mild pain since episode    No nausea or vomiting   Denies dysuria, hematuria or severe pain     URI  Patient with c/o cough, sore throat, nasal congestion and hoarse voice for 2 days  Reports symptoms are worse today but hoarseness improved   Denies fever, chills, SOB, chest pain   Requesting  work note for today       Other chronic conditions, if discussed, are documented below in the A/P  Problem List and Family history in the EMR have been reviewed and updated as appropriate    ASSESSMENT/   PLAN       Josilyn was seen today for cough and other.    Diagnoses and all orders for this visit:    Kidney stone  Overview:  CT RENAL STONE PROTOCOL 11/27/23  IMPRESSION:   1. Obstructing 10 mm stone within the distal right ureter just proximal to the UVJ causing mild obstructive uropathy.   2.  Additional bilateral nonobstructing nephrolithiasis.   3.  Mild sigmoid diverticulosis without diverticulitis.     Assessment & Plan:  Stable   10 mm right renal stone detected on CT completed 11/27/23   Patient went to ER on 11/27/23 as advised and was discharged after triage due to stable symptoms   STAT referral to urology placed for further assessment and management   ER precautions discussed   Continue to monitor   F/u if symptoms worsen or persist     Orders:  -     Consult/Referral To Urology Ext Com [facility]    COVID-19  Assessment & Plan:  Active  Patient presented with URI symptoms   Positive POCT COVID testing   Negative influenza A&B POCT test   Work note written. Dates: 4/14-4/17  Plan for supportive care  Recommend proper fluid intake and rest  Continue home isolation until at least 5 days have passed since your symptoms first appeared    ED precautions given.  Monitor   F/u if symptoms worsen or persist     Orders:  -     INFLUENZA A&B TEST POCT  -     Covid-19 Antigen (POCT)          ICD-10-CM ICD-9-CM    1. Kidney stone  N20.0 592.0 Consult/Referral To Urology Ext Com Non[facility]      2. COVID-19  U07.1 079.89 INFLUENZA A&B TEST POCT      Covid-19 Antigen (POCT)              SUPPORTING OBJECTIVE  / SUBJECTIVE     PHYSICAL EXAM:   12/01/23  1300   BP: 102/72   Pulse: 85   Temp: 98 F (36.7 C)   Resp: 16   SpO2: 100%     Ht Readings from Last 1 Encounters:   12/01/23 5\' 4"  (1.626 m)     Wt Readings from  Last 1 Encounters:   12/01/23 66.7 kg (147 lb)     Body mass index is 25.23 kg/m.        12/01/2023     1:00 PM 11/19/2023     3:55 PM 11/17/2023     2:38 PM 10/24/2023     9:43 AM 03/13/2023     4:47 PM 04/05/2021     1:12 PM   Date Weight Recorded   Metric 66.679 kg 67.9 kg 67.949 kg 65.772 kg 66.679 kg 74.844 kg   Pounds/Ounces 147 lb 149 lb 11.1 oz 149 lb 12.8 oz 145 lb 147 lb 165 lb        Physical Exam  Vitals and nursing note reviewed.   Constitutional:       General: She is not in acute distress.     Appearance: Normal appearance. She is not toxic-appearing or diaphoretic.   HENT:      Head: Normocephalic and atraumatic.      Mouth/Throat:      Pharynx: Posterior oropharyngeal erythema present.      Tonsils: No tonsillar exudate or tonsillar abscesses.   Eyes:      Extraocular Movements: Extraocular movements intact.      Conjunctiva/sclera: Conjunctivae normal.   Cardiovascular:      Rate and Rhythm: Normal rate and regular rhythm.      Pulses: Normal pulses.      Heart sounds: Normal heart sounds. No murmur heard.     No friction rub. No gallop.   Pulmonary:      Effort: Pulmonary effort is normal. No respiratory distress.      Breath sounds: Normal breath sounds. No wheezing or rales.   Musculoskeletal:      Cervical back: Normal range of motion.   Skin:     General: Skin is warm and dry.   Neurological:      Mental Status: She is alert. Mental status is at baseline.   Psychiatric:         Mood and Affect: Mood normal.         Behavior: Behavior normal.           ALLERGY / MEDICATIONS / SURGICAL  HISTORY /  SOCIAL  HISTORY / IMMUNICATIONS / ROS     ALLERGIES:  No Known Allergies       CURRENT  MEDICATIONS:  Outpatient Medications Marked as Taking for the 12/01/23 encounter (Office Visit) with Ardis Becton, DO   Medication Sig Dispense Refill    FLUoxetine  (PROZAC ) 10 MG capsule Take 1 capsule (10 mg) by mouth daily. 90 capsule 3          PAST SURGICAL HISTORY:  No past surgical history on file.     SOCIAL  HISTORY:  Socioeconomic History    Marital status: Separated   Tobacco Use    Smoking status: Never    Smokeless tobacco: Never   Substance and Sexual Activity    Alcohol use: Not Currently    Drug use: Never       Immunization History   Administered Date(s) Administered    COVID-19 (Moderna) Bivalent Vaccine >= 12 years 11/02/2021    COVID-19 (Moderna) Red Cap >= 12 Years 12/08/2020    COVID-19 [employer]) Purple Cap >= 12 Years 03/11/2020, 04/01/2020         REVIEW OF SYSTEMS:   (unless listed below, the ROS  is negative except as listed in HPI)  Review of Systems   All other systems reviewed and are negative.              By signing below, I acknowledge that I have reviewed the above note, dictated by me and scribed by Alana Hoyle, for accuracy and edited  where necessary. The note accurately reflects the services provided at this  encounter. We retain the right to modify this information in the event of  errors. Occasional errors in punctuation, grammar and content may occur.           [Electronically signed by Dr. Geneva Kerbs of [facility]]           [facility]  [email or web address]

## 2023-12-01 NOTE — Assessment & Plan Note (Addendum)
 Stable   10 mm right renal stone detected on CT completed 11/27/23   Patient went to ER on 11/27/23 as advised and was discharged after triage due to stable symptoms   STAT referral to urology placed for further assessment and management   ER precautions discussed   Continue to monitor   F/u if symptoms worsen or persist

## 2023-12-04 ENCOUNTER — Encounter (INDEPENDENT_AMBULATORY_CARE_PROVIDER_SITE_OTHER): Admitting: Family Practice

## 2023-12-15 NOTE — Progress Notes (Signed)
 This encounter was opened in error.  Please disregard.

## 2024-01-09 ENCOUNTER — Ambulatory Visit (INDEPENDENT_AMBULATORY_CARE_PROVIDER_SITE_OTHER): Admitting: Physician Assistant

## 2024-01-09 ENCOUNTER — Encounter (INDEPENDENT_AMBULATORY_CARE_PROVIDER_SITE_OTHER): Admitting: Physician Assistant

## 2024-01-09 VITALS — BP 138/88 | HR 89 | Temp 99.3°F

## 2024-01-09 LAB — UA, CHEM ONLY POCT
Bilirubin: NEGATIVE
Glucose: NEGATIVE
Ketones: NEGATIVE
Nitrite: NEGATIVE
Specific Gravity: 1.03 (ref 1.002–1.030)
Urobilinogen: 1 mg/dL (ref 0.2–1.0)
pH: 6 (ref 5.00–8.00)

## 2024-01-09 MED ORDER — TRAMADOL HCL 50 MG OR TABS: 50.0000 mg | ORAL_TABLET | Freq: Four times a day (QID) | ORAL | 0 refills | Status: DC | PRN

## 2024-01-09 NOTE — Progress Notes (Signed)
 Subjective:   Hannah Lloyd is a 43 year old female who is here for ER F/U (Pt seen in the ER 5/20 for kidney stone removal and is requesting pain medication.)      HPI     Patient was seen in the emergency department yesterday for right-sided kidney stone and possible pyelonephritis.  Stone was removed broken up any stent was placed.  Patient was told she will have some bleeding and some residual pain but was not sent home with any pain medication.  Patient states she is here for pain medication only.     I attempted to ask patient about her pain where was coming from how strong her pain was if she was having any urinary tract infection symptoms including fever chills body aches flank pain burning with urination however patient became very upset and started shouting.  I attempted to advised patient that it was my job to make sure that she was not having worsening urinary tract infection, pyelonephritis, sepsis, or other.    Patient very frustrated during interview and stated I do not know why you are asking me so many questions, I do not want to answer questions I am only here for pain medication.  If you do not want to give me that I will just go back to the hospital.  Nobody wants to help me and no body cares.     Ultimately patient was able to relax apologize and answer my questions.  She is continuing to have right-sided flank pain that radiates around to her pelvic area along with pain with urination and breathing.  She denies fever chills nausea vomiting diarrhea vaginal discharge.  Patient states she is continuing to take Cipro twice a day.    And was able to contact PA Ludie Chant who saw patient in the hospital and explained that urine culture came back negative from the hospital and she was treated with Rocephin there and sent home on Cipro after having a stent placed in right ureter and lithotripsy done.    Review of Systems     ROS negative other than what is stated in HPI      Cipro 1 tablet  (250 mg).      FLUoxetine  Take 1 capsule (10 mg) by mouth daily. 90 capsule 3    traMADol  Take 1 tablet (50 mg) by mouth every 6 hours as needed for Moderate Pain (Pain Score 4-6). 15 tablet 0     No Known Allergies    Reviewed patients pertinent information related to social history, past medical, past surgical, and family history.     Objective:  Vital signs: BP 138/88 (BP Location: Left arm, BP Patient Position: Sitting, BP cuff size: Regular)   Pulse 89   Temp 99.3 F (37.4 C) (Tympanic)   SpO2 99%     Physical Exam  Vitals and nursing note reviewed.   Constitutional:       General: She is not in acute distress.     Appearance: Normal appearance. She is well-developed. She is not ill-appearing, toxic-appearing or diaphoretic.   Eyes:      Conjunctiva/sclera: Conjunctivae normal.   Cardiovascular:      Rate and Rhythm: Normal rate.   Pulmonary:      Effort: Pulmonary effort is normal.   Neurological:      Mental Status: She is alert and oriented to person, place, and time.      Cranial Nerves: No dysarthria or facial asymmetry.  Psychiatric:         Thought Content: Thought content normal.         Judgment: Judgment normal.      Comments: Patient visibly agitated upset and yelling           Assessment/Plan:  Hannah Lloyd was seen today for er f/u.    Diagnoses and all orders for this visit:    Flank pain  -     traMADol  (ULTRAM ) 50 MG tablet; Take 1 tablet (50 mg) by mouth every 6 hours as needed for Moderate Pain (Pain Score 4-6).  -     UA, Chem Only (POCT)  -     Urine Culture - See Instructions Urine    Other orders  -     CURES Review Documentation - I Reviewed CURES       Active.  UA clinic shows blood in 125 leukocytes.  We will culture patient's urine and recommend that she continue Cipro this time as she has only been on it for 1 day.  We will call in tramadol  for pain to take as needed.  Advised that if symptoms worsening in the next 2-3 days to return to clinic or emergency department including fever  chills worsening flank pain abdominal pain lightheaded weakness nausea vomiting.  Patient states she has an appointment follow up in 3-4 days.    Diagnosed with:    ICD-10-CM ICD-9-CM    1. Flank pain  R10.9 789.09 traMADol  (ULTRAM ) 50 MG tablet      UA, Chem Only (POCT)      Urine Culture - See Instructions Urine          Electronically signed and reviewed by Jackee Phi PA-C

## 2024-01-12 LAB — URINE CULTURE

## 2024-01-13 ENCOUNTER — Ambulatory Visit (INDEPENDENT_AMBULATORY_CARE_PROVIDER_SITE_OTHER): Admitting: Internal Medicine

## 2024-01-13 ENCOUNTER — Encounter (INDEPENDENT_AMBULATORY_CARE_PROVIDER_SITE_OTHER): Payer: Self-pay | Admitting: Internal Medicine

## 2024-01-13 VITALS — BP 114/76 | HR 77 | Temp 97.3°F | Resp 17 | Ht 64.0 in | Wt 152.0 lb

## 2024-01-13 MED ORDER — IBUPROFEN 600 MG OR TABS: 600.0000 mg | ORAL_TABLET | Freq: Three times a day (TID) | ORAL | 0 refills | Status: DC | PRN

## 2024-01-13 NOTE — Progress Notes (Signed)
 [place] - [place]-  [place] - [place] - [place]   [email or web address] and [email or web address]  Telephone: [phone]      CONCERN     Encounter Date: 01/13/2024  3:10 PM PDT    PCP: Donalee Gaudy  MRN: [id number]   DOB: [birth year]     CONCERN:   Hannah Lloyd is a 43 year old female presents   Chief Complaint   Patient presents with    Hospital F/U     [facility] 05/19-05/22 kidney stone and stent replacement     Hospital Discharge Follow Up  [facility] 01/05/2024 - 01/08/2024  Hospital Course: Patient was admitted due to an obstructing kidney stone with hydronephrosis. urology was consulted and the patient was eventually taken for a cystoscopy, right ureteroscopy, and laser lithotripsy with stent placement on 01/07/2024 with Dr. Dempsey Cramp. Her urine culture eventually resulted with greater 100,000 colonies of distal flora with 3 or more organisms. The patient was treated with Rocephin while inpatient and her leukocytosis resolved. Due to her stent placement she will be continued on ciprofloxacin at time of discharge for an additional 5 days. The patient tolerated her procedure well and is overall stable. She is medically stable for discharge will be discharged home today. She has instruction to follow-up with PCP and urology after discharge for continued treatment and monitoring. See below breakdown for further information on patient's overall hospitalization.    Patient had two stents placed by Dr Cramp. Patient notes that Dr Cramp was able to completely destroy the stone.   Patient continues on Tramadol  q6 hours to help manage her pain.  Patient reports that she is able to feel the stent, and that it is uncomfortable.   Patient has not had any fevers or chills.   S/P Rocephin 01/06/24 -> 01/07/24 during admission. Continues on Cipro 250mg  BID x 5 days.   Patient has been urinating without any difficulty/concerns.   Patient reports that her right knee has been swollen. No known injury to the area. Symptoms are  localized to patient's knee. No swelling of her lower leg. Patient first noticed the pain postop when getting up to use the restroom.     Other chronic conditions, if discussed, are documented below in the A/P  Problem List and Family history in the EMR have been reviewed and updated as appropriate    ASSESSMENT/   PLAN       [name] was seen today for hospital f/u.    Diagnoses and all orders for this visit:    Hospital discharge follow-up  Overview:  [facility] 01/05/2024 - 01/08/2024  Hospital Course: Patient was admitted due to an obstructing kidney stone with hydronephrosis. urology was consulted and the patient was eventually taken for a cystoscopy, right ureteroscopy, and laser lithotripsy with stent placement on 01/07/2024 with Dr. Dempsey Cramp. Her urine culture eventually resulted with greater 100,000 colonies of distal flora with 3 or more organisms. The patient was treated with Rocephin while inpatient and her leukocytosis resolved. Due to her stent placement she will be continued on ciprofloxacin at time of discharge for an additional 5 days. The patient tolerated her procedure well and is overall stable. She is medically stable for discharge will be discharged home today. She has instruction to follow-up with PCP and urology after discharge for continued treatment and monitoring. See below breakdown for further information on patient's overall hospitalization.    Assessment & Plan:  Stable  Hospital course and results reviewed in detail with patient   All  questions and concerns were addressed         Nephrolithiasis  Assessment & Plan:  Active   CT Abd/Pelvis showed A 12 x 6 x 7 mm calculus is seen in the right vesicoureteric junction causing moderate proximal hydroureter and gross hydronephrosis on the right side and a 5 mm non-obstructing renal calculus in both kidneys, located in the lower calyces during hospital admission.   Patient s/p cystoscopy, right ureteroscopy, and laser lithotripsy with stent  placement x2 on 01/07/2024 with Dr. Dempsey Cramp.  Patient to follow up with Dr Cramp for stent removal in 2 weeks time.  Patient denies any fevers or chills.   Advised patient to follow up with Dr Cramp as scheduled.   Continue on Cipro 250mg  bid x5 days as prescribed at discharge.   Will continue to monitor.   Follow up with specialist as directed.     Orders:  -     ibuprofen  (MOTRIN ) 600 MG tablet; Take 1 tablet (600 mg) by mouth 3 times daily as needed for Mild Pain (Pain Score 1-3) or Moderate Pain (Pain Score 4-6).    Swelling of right knee  Assessment & Plan:  Stable  Patient with swelling of R knee   Patient has no known injury to the area although possible that it was aggravated during admission/postop transfer  ? 2/2 Ciprofloxacin  Low suspicion for DVT given localization of symptoms   Recommend warm/cold compresses, IcyHot/Bengay/Biofreeze, light stretching and regular exercise as tolerating, NSAIDs prn, sleep in a comfortable position  Avoid strenuous activity/heavy lifting   Will continue to monitor  Follow up for persistent or worsening symptoms            ICD-10-CM ICD-9-CM    1. Hospital discharge follow-up  Z09 V67.59       2. Nephrolithiasis  N20.0 592.0 ibuprofen  (MOTRIN ) 600 MG tablet      3. Swelling of right knee  M25.461 719.06               SUPPORTING OBJECTIVE  / SUBJECTIVE     PHYSICAL EXAM:   01/13/24  1537   BP: 114/76   Pulse: 77   Temp: 97.3 F (36.3 C)   Resp: 17   SpO2: 99%     Ht Readings from Last 1 Encounters:   01/13/24 5' 4 (1.626 m)     Wt Readings from Last 1 Encounters:   01/13/24 68.9 kg (152 lb)     Body mass index is 26.09 kg/m.        01/13/2024     3:37 PM 12/04/2023     9:21 AM 12/01/2023     1:00 PM 11/19/2023     3:55 PM 11/17/2023     2:38 PM 10/24/2023     9:43 AM   Date Weight Recorded   Metric 68.947 kg 67.586 kg 66.679 kg 67.9 kg 67.949 kg 65.772 kg   Pounds/Ounces 152 lb 149 lb 147 lb 149 lb 11.1 oz 149 lb 12.8 oz 145 lb        Physical Exam  Vitals and nursing note  reviewed. Exam conducted with a chaperone present.   Constitutional:       General: She is not in acute distress.     Appearance: Normal appearance. She is well-developed. She is not ill-appearing, toxic-appearing or diaphoretic.   HENT:      Head: Normocephalic and atraumatic.   Eyes:      General:  Right eye: No discharge.         Left eye: No discharge.      Conjunctiva/sclera: Conjunctivae normal.      Pupils: Pupils are equal, round, and reactive to light.   Cardiovascular:      Rate and Rhythm: Normal rate and regular rhythm.      Heart sounds: Normal heart sounds.   Pulmonary:      Effort: Pulmonary effort is normal. No respiratory distress.      Breath sounds: Normal breath sounds. No stridor. No wheezing.   Musculoskeletal:      Right knee: Swelling present. No deformity or lacerations. Tenderness present.      Left knee: Normal.   Skin:     General: Skin is warm.   Neurological:      General: No focal deficit present.      Mental Status: She is alert and oriented to person, place, and time. Mental status is at baseline.   Psychiatric:         Mood and Affect: Mood normal.         Behavior: Behavior normal.           ALLERGY / MEDICATIONS / SURGICAL  HISTORY /  SOCIAL HISTORY / IMMUNICATIONS / ROS     ALLERGIES:  No Known Allergies       CURRENT  MEDICATIONS:  Outpatient Medications Marked as Taking for the 01/13/24 encounter (Office Visit) with Wilhemena Browning, MD   Medication Sig Dispense Refill    CIPRO 250 MG tablet 1 tablet (250 mg).      FLUoxetine  (PROZAC ) 10 MG capsule Take 1 capsule (10 mg) by mouth daily. 90 capsule 3    ondansetron  (ZOFRAN  ODT) 4 MG disintegrating tablet Take 1 tablet (4 mg) on or under the tongue.      tamsulosin  (FLOMAX ) 0.4 MG capsule 1 capsule (0.4 mg).      traMADol  (ULTRAM ) 50 MG tablet Take 1 tablet (50 mg) by mouth every 6 hours as needed for Moderate Pain (Pain Score 4-6). 15 tablet 0          PAST SURGICAL HISTORY:  No past surgical history on file.      SOCIAL HISTORY:  Socioeconomic History    Marital status: Separated   Tobacco Use    Smoking status: Never    Smokeless tobacco: Never   Substance and Sexual Activity    Alcohol use: Not Currently    Drug use: Never       Immunization History   Administered Date(s) Administered    COVID-19 (Moderna) Bivalent Vaccine >= 12 years 11/02/2021    COVID-19 (Moderna) Red Cap >= 12 Years 12/08/2020    COVID-19 [employer]) Purple Cap >= 12 Years 03/11/2020, 04/01/2020         REVIEW OF SYSTEMS:   (unless listed below, the ROS  is negative except as listed in HPI)  Review of Systems   Musculoskeletal:         + R knee swelling   All other systems reviewed and are negative.      By signing below, I acknowledge that I have reviewed the above note,  dictated by me and scribed by Madelynn Campagna, for accuracy and edited  where necessary. The note accurately reflects the services provided at this  encounter. We retain the right to modify this information in the event of  errors. Occasional errors in punctuation, grammar and content may occur.      Electronically signed  and reviewed by Rosaline Hashimoto, MD    [facility]  [email or web address]

## 2024-01-13 NOTE — Assessment & Plan Note (Signed)
 Active   CT Abd/Pelvis showed A 12 x 6 x 7 mm calculus is seen in the right vesicoureteric junction causing moderate proximal hydroureter and gross hydronephrosis on the right side and a 5 mm non-obstructing renal calculus in both kidneys, located in the lower calyces during hospital admission.   Patient s/p cystoscopy, right ureteroscopy, and laser lithotripsy with stent placement x2 on 01/07/2024 with Dr. Dempsey Cramp.  Patient to follow up with Dr Cramp for stent removal in 2 weeks time.  Patient denies any fevers or chills.   Advised patient to follow up with Dr Cramp as scheduled.   Continue on Cipro 250mg  bid x5 days as prescribed at discharge.   Will continue to monitor.   Follow up with specialist as directed.

## 2024-01-13 NOTE — Assessment & Plan Note (Addendum)
 Stable  Patient with swelling of R knee   Patient has no known injury to the area although possible that it was aggravated during admission/postop transfer  ? 2/2 Ciprofloxacin  Low suspicion for DVT given localization of symptoms   Recommend warm/cold compresses, IcyHot/Bengay/Biofreeze, light stretching and regular exercise as tolerating, NSAIDs prn, sleep in a comfortable position  Avoid strenuous activity/heavy lifting   Will continue to monitor  Follow up for persistent or worsening symptoms

## 2024-01-13 NOTE — Assessment & Plan Note (Signed)
 Stable  Hospital course and results reviewed in detail with patient   All questions and concerns were addressed

## 2024-01-14 ENCOUNTER — Telehealth (INDEPENDENT_AMBULATORY_CARE_PROVIDER_SITE_OTHER): Payer: Self-pay | Admitting: Internal Medicine

## 2024-01-14 NOTE — Telephone Encounter (Signed)
 Patient calling regarding yesterdays visit, patient stated a note was going to be given to her yesterday regarding her being off for X 2 weeks and she totally forgot to ask for it. Can we please give patient a work note and also upload to mychart when ready Thank You

## 2024-01-15 ENCOUNTER — Encounter (INDEPENDENT_AMBULATORY_CARE_PROVIDER_SITE_OTHER): Payer: Self-pay | Admitting: Hospital

## 2024-01-15 NOTE — Progress Notes (Signed)
 Error

## 2024-01-15 NOTE — Result Encounter Note (Signed)
 PLEASE INFORM PT THAT URINE CULTURE WAS NEGATIVE FOR BACTERIAL INFECTION.

## 2024-01-16 NOTE — Telephone Encounter (Signed)
 Letter sent via: Allene Wilhemena Browning, MD  [facility] Mychart Clinical Pool15 hours ago (5:57 PM)       Ok to provide letter off work until 01/27/24. thx

## 2024-02-09 ENCOUNTER — Other Ambulatory Visit (INDEPENDENT_AMBULATORY_CARE_PROVIDER_SITE_OTHER): Payer: Self-pay

## 2024-02-09 DIAGNOSIS — F332 Major depressive disorder, recurrent severe without psychotic features: Secondary | ICD-10-CM

## 2024-08-30 ENCOUNTER — Ambulatory Visit (INDEPENDENT_AMBULATORY_CARE_PROVIDER_SITE_OTHER): Admitting: Physician Assistant

## 2024-08-30 ENCOUNTER — Encounter (INDEPENDENT_AMBULATORY_CARE_PROVIDER_SITE_OTHER): Payer: Self-pay

## 2024-08-30 VITALS — BP 126/84 | HR 84 | Temp 98.0°F | Resp 14

## 2024-08-30 LAB — INFLUENZA A&B TEST POCT: Influenza A/B Combined: NEGATIVE

## 2024-08-30 LAB — COVID-19 AB TEST POCT: COVID-19 POCT IgG: NEGATIVE

## 2024-08-30 NOTE — Progress Notes (Signed)
 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  She denies any fevers, SOB, nausea or vomiting.  No otc's were taken for her sxs, denies any hx of asthma, COPD or allergic rhinitis.  )      Cough  Associated symptoms include ear pain. Pertinent negatives include no chest pain, chills, fever, shortness of breath or wheezing.   Ear Pain   Associated symptoms include coughing.        See CC   In no acute distress O/W well appearing     Review of Systems   Constitutional:  Negative for chills and fever.   HENT:  Positive for congestion and ear pain.    Respiratory:  Positive for cough. Negative for shortness of breath and wheezing.    Cardiovascular:  Negative for chest pain, palpitations and leg swelling.   Gastrointestinal: Negative.    Neurological: Negative.    All other systems reviewed and are negative.       ROS negative other than what is stated in HPI      FLUoxetine  Take 1 capsule (10 mg) by mouth daily. 90 capsule 3    ibuprofen  Take 1 tablet (600 mg) by mouth 3 times daily as needed for Mild Pain (Pain Score 1-3) or Moderate Pain (Pain Score 4-6). 90 tablet 0    ondansetron  Take 1 tablet (4 mg) on or under the tongue.      tamsulosin  1 capsule (0.4 mg).      traMADol  Take 1 tablet (50 mg) by mouth every 6 hours as needed for Moderate Pain (Pain Score 4-6). 15 tablet 0     Allergies[1]    Reviewed patients pertinent information related to social history, past medical, past surgical, and family history.     Objective:  Vital signs: BP 126/84 (BP Location: Left arm, BP Patient Position: Sitting, BP cuff size: Regular)   Pulse 84   Temp 98 F (36.7 C) (Temporal)   Resp 14   SpO2 98%     Physical Exam  Vitals and nursing note reviewed.   Constitutional:       General: She is not in acute  distress.     Appearance: Normal appearance. She is well-developed. She is not ill-appearing, toxic-appearing or diaphoretic.   HENT:      Head: Normocephalic.      Right Ear: Tympanic membrane, ear canal and external ear normal.      Left Ear: Tympanic membrane, ear canal and external ear normal.      Nose: Congestion and rhinorrhea present.      Mouth/Throat:      Mouth: Mucous membranes are moist.      Pharynx: No oropharyngeal exudate.      Comments: Oropharynx is erythematous without tonsilar exudates   Uvula is midline  Cardiovascular:      Rate and Rhythm: Normal rate and regular rhythm.      Heart sounds: Normal heart sounds. No murmur heard.     No friction rub. No gallop.   Pulmonary:      Effort: Pulmonary effort is normal. No respiratory distress.      Breath sounds: Normal breath sounds. No stridor. No wheezing, rhonchi or rales.   Chest:      Chest  wall: No tenderness.   Lymphadenopathy:      Cervical: No cervical adenopathy.   Skin:     Coloration: Skin is not jaundiced.      Findings: No rash.   Neurological:      Mental Status: She is alert and oriented to person, place, and time.   Psychiatric:         Behavior: Behavior normal.         Thought Content: Thought content normal.         Judgment: Judgment normal.           Assessment/Plan:  Hannah Lloyd was seen today for cough, sore throat - simple, ear pain and headaches.    Diagnoses and all orders for this visit:    Cough, unspecified type  -     COVID-19 AB TEST POCT  -     INFLUENZA A&B TEST POCT       Assessment:  Viral Upper Respiratory Infection (URI) - Patient presents with nasal congestion, sore throat, cough, fatigue, and mild headache. Denies high fever, shortness of breath, or chest pain. No signs of bacterial superinfection. Symptoms and exam findings consistent with uncomplicated viral URI.  Differential Diagnosis (most to least severe):  Influenza neg  COVID-19 neg  Bacterial infection     Plan:  Status: Patient stable; no red flag  symptoms (e.g., respiratory distress, persistent high fever, unilateral facial swelling, or purulent nasal discharge).  Decision-making: Clinical presentation consistent with self-limited viral illness. No antibiotics indicated. Supportive care recommended to manage symptoms and promote recovery.  Treatment:  Rest and increase fluid intake  Saline nasal spray and humidifier use at home  Symptom relief options:  Acetaminophen or ibuprofen  for fever, sore throat, and body aches  Guaifenesin (Mucinex) for chest congestion  Dextromethorphan (DM) for dry cough  Antihistamines for nasal drainage or congestion:  Loratadine, cetirizine, or diphenhydramine  Nasal sprays (if needed):  Fluticasone (Flonase)  Oxymetazoline (Afrin) -- limit to 3 days  Optional: throat lozenges or warm salt water gargles for sore throat  Medication counseling: Reviewed medication names, purposes, proper use, and side effects. Emphasized importance of hydration, rest, and avoiding unnecessary antibiotic use.  Monitoring: Symptoms may last 7-10 days. Cough or fatigue may persist slightly longer.  Follow-up:  Return to clinic or follow up with PCP in 48 hours if symptoms worsen  Routine follow-up if no improvement by day 10 or new concerning symptoms develop    ER/Return Precautions:  Go to the ER or seek urgent care for:  Difficulty breathing or wheezing  Persistent high fever (>102F) not controlled by OTC meds  Chest pain or pressure  Severe sore throat with difficulty swallowing or drooling  Ear pain, facial pain, or swelling  Any new or rapidly worsening symptoms      Diagnosed with:    ICD-10-CM ICD-9-CM    1. Cough, unspecified type  R05.9 786.2 COVID-19 AB TEST POCT      INFLUENZA A&B TEST POCT          Electronically signed and reviewed by Jackee Phi PA-C        [1] No Known Allergies

## 2024-08-31 ENCOUNTER — Telehealth (INDEPENDENT_AMBULATORY_CARE_PROVIDER_SITE_OTHER): Payer: Self-pay | Admitting: Physician Assistant

## 2024-08-31 NOTE — Telephone Encounter (Signed)
 Patient called stating that she is not feeling better and at her visit here yesterday, Ernie mentioned to her she could call and request Azithromycin  if no improvement. I read Ernie's plan that did NOT state this, and instead stated that antibiotics were not indicated and to follow up with PCP or RTC if sx's worsen over the next 48 hours. Patient stated she did not want to RTC and we ended the call.

## 2024-09-01 ENCOUNTER — Telehealth (INDEPENDENT_AMBULATORY_CARE_PROVIDER_SITE_OTHER)

## 2024-09-02 NOTE — Progress Notes (Signed)
 This encounter was opened in error.  Please disregard.

## 2024-09-15 ENCOUNTER — Telehealth

## 2024-09-15 ENCOUNTER — Encounter (INDEPENDENT_AMBULATORY_CARE_PROVIDER_SITE_OTHER): Payer: Self-pay

## 2024-09-15 MED ORDER — HYDROXYZINE HCL 10 MG OR TABS: 10.0000 mg | ORAL_TABLET | Freq: Every evening | ORAL | 0 refills | Status: AC | PRN

## 2024-09-17 NOTE — Assessment & Plan Note (Signed)
 Worsening  Continue medications as directed  Order for atarax  to use sparingly  Referral to psychiatry and psychology  1 week letter off work note given  Denies SI/HI    Consider mental health providers, cognitive behavioral health, and support groups  Advised exercising regularly - aim for at least 30 minutes of moderate exercise on most days of the week  Engage in stress-reducing activity and practice relaxation techniques such as deep breathing, meditation, or yoga  Recommend a diet rich in whole foods such as fruits, vegetables, whole grains, and lean protein  Avoid inflammatory foods such as processed foods, red meats, sugary foods, trans fats, and refined grains  Avoid or limit caffeine intake (coffee, tea, soda), and alcohol use  Work on good sleep hygiene and practice mindfulness  Recommend keeping a daily gratitude journal  Stressed importance of seeking support  Discussed podcast recommendations: Scientist, Research (medical) and app Headspace, Cerebral, Calm  Recommend referral to counselor if not done already and engagement in a personal mindfulness practice that may include meditation, yoga, and similar activities on a regular basis.   Denies SI/HI  Pt to call the office if symptoms worsen    If you ever feel like you might hurt yourself or someone else, do one of these things:   Call your doctor or nurse and tell them it is urgent   Call for an ambulance (in the [place]  and [place], dial 911)  Go to the emergency room at your local hospital   Call the National Suicide Prevention Lifeline: dial 988      Applies to all SSRIs: Onset of effect may be delayed 2 to 4 weeks or more. Adverse effects among the SSRIs include: Nausea, diarrhea, insomnia/agitation, somnolence, impaired sexual function, and hyponatremia
